# Patient Record
Sex: Female | Born: 1940 | Race: White | Hispanic: No | Smoking: Former smoker
Health system: Southern US, Community
[De-identification: ages and names within clinical notes are randomized; demographics above are authoritative.]

## PROBLEM LIST (undated history)

## (undated) DIAGNOSIS — B029 Zoster without complications: Secondary | ICD-10-CM

## (undated) DIAGNOSIS — M1990 Unspecified osteoarthritis, unspecified site: Secondary | ICD-10-CM

## (undated) DIAGNOSIS — F329 Major depressive disorder, single episode, unspecified: Secondary | ICD-10-CM

## (undated) DIAGNOSIS — E669 Obesity, unspecified: Secondary | ICD-10-CM

## (undated) DIAGNOSIS — R0602 Shortness of breath: Secondary | ICD-10-CM

## (undated) DIAGNOSIS — F32A Depression, unspecified: Secondary | ICD-10-CM

## (undated) DIAGNOSIS — R7303 Prediabetes: Secondary | ICD-10-CM

## (undated) DIAGNOSIS — E785 Hyperlipidemia, unspecified: Secondary | ICD-10-CM

## (undated) DIAGNOSIS — F419 Anxiety disorder, unspecified: Secondary | ICD-10-CM

## (undated) DIAGNOSIS — J42 Unspecified chronic bronchitis: Secondary | ICD-10-CM

## (undated) DIAGNOSIS — I10 Essential (primary) hypertension: Secondary | ICD-10-CM

## (undated) HISTORY — PX: TONSILLECTOMY: SUR1361

## (undated) HISTORY — PX: TUBAL LIGATION: SHX77

## (undated) HISTORY — PX: JOINT REPLACEMENT: SHX530

## (undated) HISTORY — DX: Prediabetes: R73.03

## (undated) HISTORY — DX: Hyperlipidemia, unspecified: E78.5

---

## 2008-10-11 ENCOUNTER — Emergency Department (HOSPITAL_COMMUNITY): Admission: EM | Admit: 2008-10-11 | Discharge: 2008-10-12 | Payer: Self-pay | Admitting: Emergency Medicine

## 2011-04-26 ENCOUNTER — Other Ambulatory Visit: Payer: Self-pay | Admitting: Family Medicine

## 2011-04-26 DIAGNOSIS — Z1231 Encounter for screening mammogram for malignant neoplasm of breast: Secondary | ICD-10-CM

## 2011-05-02 ENCOUNTER — Ambulatory Visit
Admission: RE | Admit: 2011-05-02 | Discharge: 2011-05-02 | Disposition: A | Discharge status: Home / Self Care | Payer: Medicare Other | Source: Ambulatory Visit | Attending: Family Medicine | Admitting: Family Medicine

## 2011-05-02 DIAGNOSIS — Z1231 Encounter for screening mammogram for malignant neoplasm of breast: Secondary | ICD-10-CM

## 2011-11-28 ENCOUNTER — Emergency Department (HOSPITAL_COMMUNITY): Payer: PRIVATE HEALTH INSURANCE

## 2011-11-28 ENCOUNTER — Encounter: Payer: Self-pay | Admitting: Emergency Medicine

## 2011-11-28 ENCOUNTER — Emergency Department (HOSPITAL_COMMUNITY)
Admission: EM | Admit: 2011-11-28 | Discharge: 2011-11-28 | Disposition: A | Discharge status: Home / Self Care | Payer: PRIVATE HEALTH INSURANCE | Attending: Emergency Medicine | Admitting: Emergency Medicine

## 2011-11-28 DIAGNOSIS — R42 Dizziness and giddiness: Secondary | ICD-10-CM | POA: Insufficient documentation

## 2011-11-28 DIAGNOSIS — J3489 Other specified disorders of nose and nasal sinuses: Secondary | ICD-10-CM | POA: Insufficient documentation

## 2011-11-28 DIAGNOSIS — I10 Essential (primary) hypertension: Secondary | ICD-10-CM | POA: Insufficient documentation

## 2011-11-28 HISTORY — DX: Obesity, unspecified: E66.9

## 2011-11-28 HISTORY — DX: Essential (primary) hypertension: I10

## 2011-11-28 LAB — BASIC METABOLIC PANEL
BUN: 18 mg/dL (ref 6–23)
Glucose, Bld: 131 mg/dL — ABNORMAL HIGH (ref 70–99)
Sodium: 138 mEq/L (ref 135–145)

## 2011-11-28 LAB — CBC
MCHC: 33.4 g/dL (ref 30.0–36.0)
Platelets: 292 10*3/uL (ref 150–400)
WBC: 10.7 10*3/uL — ABNORMAL HIGH (ref 4.0–10.5)

## 2011-11-28 MED ORDER — MECLIZINE HCL 25 MG PO TABS: 25.0000 mg | ORAL_TABLET | Freq: Four times a day (QID) | ORAL | Status: AC

## 2011-11-28 MED ORDER — MECLIZINE HCL 25 MG PO TABS
25.0000 mg | ORAL_TABLET | Freq: Once | ORAL | Status: AC
  Administered 2011-11-28: 25 mg via ORAL
  Filled 2011-11-28: qty 1

## 2011-11-28 MED ORDER — MOMETASONE FUROATE 50 MCG/ACT NA SUSP: 2.0000 | Freq: Every day | NASAL | Status: DC

## 2011-11-28 MED ORDER — LORAZEPAM 1 MG PO TABS
1.0000 mg | ORAL_TABLET | Freq: Once | ORAL | Status: AC
Start: 2011-11-28 — End: 2011-11-28
  Administered 2011-11-28: 1 mg via ORAL
  Filled 2011-11-28: qty 1

## 2011-11-28 MED ORDER — SODIUM CHLORIDE 0.9 % IV BOLUS (SEPSIS)
1000.0000 mL | Freq: Once | INTRAVENOUS | Status: AC
  Administered 2011-11-28: 1000 mL via INTRAVENOUS

## 2011-11-28 NOTE — ED Notes (Signed)
Pt ambulated in the hallway without difficulty. Denied dizziness, lightheaded or weakness while walking.

## 2011-11-28 NOTE — ED Notes (Signed)
PT back from CT.  AO x 4.  States dizziness only when she turns her head.

## 2011-11-28 NOTE — ED Notes (Signed)
PT. WOKE UP AT 3 AM THIS MORNING WITH DIZZINESS AND NAUSEA , NO VOMITTING , " I'M HUNGRY" ,  ALSO REPORTS FEELING SWEATY / DIZZINESS WORSE WHEN LYING DOWN .

## 2011-11-28 NOTE — ED Provider Notes (Signed)
History     CSN: [id number]  Arrival date & time 11/28/11  0405   First MD Initiated Contact with Patient 11/28/11 [phone]      Chief Complaint  Patient presents with  . Dizziness    (Consider location/radiation/quality/duration/timing/severity/associated sxs/prior treatment) The history is provided by the patient.   onset today although the and feeling congested for about last month. Sudden onset of severe dizziness when she turned her head to the right. Symptoms resolved when she is lying still and sitting up. She was able to drive herself to the hospital. She denies any headache or difficulty walking. No weakness or numbness. No history of same. She has not tried medications. No pain or radiation. Symptoms severe when present.  Past Medical History  Diagnosis Date  . Hypertension   . Obesity     Past Surgical History  Procedure Date  . Tubal ligation   . Tonsillectomy     No family history on file.  History  Substance Use Topics  . Smoking status: Former Games developer  . Smokeless tobacco: Not on file  . Alcohol Use: No    OB History    Grav Para Term Preterm Abortions TAB SAB Ect Mult Living                  Review of Systems  Constitutional: Negative for fever and chills.  HENT: Negative for neck pain and neck stiffness.   Eyes: Negative for pain.  Respiratory: Negative for shortness of breath.   Cardiovascular: Negative for chest pain.  Gastrointestinal: Negative for abdominal pain.  Genitourinary: Negative for dysuria.  Musculoskeletal: Negative for back pain.  Skin: Negative for rash.  Neurological: Positive for dizziness. Negative for headaches.  All other systems reviewed and are negative.    Allergies  Review of patient's allergies indicates no known allergies.  Home Medications   Current Outpatient Rx  Name Route Sig Dispense Refill  . PSEUDOEPHEDRINE HCL 30 MG PO TABS Oral Take 30 mg by mouth every 6 (six) hours as needed. Sinus congestion        BP 155/77  Pulse 72  Temp(Src) 97.7 F (36.5 C) (Oral)  Resp 17  SpO2 95%  Physical Exam  Constitutional: She is oriented to person, place, and time. She appears well-developed and well-nourished.  HENT:  Head: Normocephalic and atraumatic.  Right Ear: External ear normal.  Left Ear: External ear normal.  Mouth/Throat: Oropharynx is clear and moist. No oropharyngeal exudate.       Mild nasal congestion  Eyes: Conjunctivae and EOM are normal. Pupils are equal, round, and reactive to light.  Neck: Full passive range of motion without pain. Neck supple. No thyromegaly present.       No meningismus  Cardiovascular: Normal rate, regular rhythm, S1 normal, S2 normal and intact distal pulses.   Pulmonary/Chest: Effort normal and breath sounds normal.  Abdominal: Soft. Bowel sounds are normal. There is no tenderness. There is no CVA tenderness.  Musculoskeletal: Normal range of motion.  Neurological: She is alert and oriented to person, place, and time. She has normal strength and normal reflexes. No cranial nerve deficit or sensory deficit. She displays a negative Romberg sign. GCS eye subscore is 4. GCS verbal subscore is 5. GCS motor subscore is 6.       Normal Gait. No nystagmus  Skin: Skin is warm and dry. No rash noted. No cyanosis. Nails show no clubbing.  Psychiatric: She has a normal mood and affect. Her speech is  normal and behavior is normal.    ED Course  Procedures (including critical care time)  Labs Reviewed  CBC - Abnormal; Notable for the following:    WBC 10.7 (*)    All other components within normal limits  BASIC METABOLIC PANEL - Abnormal; Notable for the following:    Glucose, Bld 131 (*)    GFR calc non Af Amer 89 (*)    All other components within normal limits   Ct Head Wo Contrast  11/28/2011  *RADIOLOGY REPORT*  Clinical Data: Dizziness and sweating.  Heaviness in the right year.  Sore throat.  CT HEAD WITHOUT CONTRAST  Technique:  Contiguous axial  images were obtained from the base of the skull through the vertex without contrast.  Comparison: None.  Findings: The ventricles and sulci appear symmetrical without mass effect or midline shift.  No abnormal extra-axial fluid collections.  No ventricular dilatation.  Gray-white matter junctions are distinct.  Basal cisterns are not effaced.  No evidence of acute intracranial hemorrhage. Vascular calcifications. No depressed skull fractures.  Visualized paranasal sinuses are not opacified.  IMPRESSION: No evidence of acute intracranial hemorrhage, mass lesion, or acute infarct.  Original Report Authenticated By: Marlon Pel, M.D.   IV fluids. Ativan by mouth. Antivert by mouth. Labs and CT scan obtained and reviewed as above. Recheck at 7:30 AM is feeling much better, ambulating no acute distress and has no focal deficits on neuro exam.   MDM   Clinical presentation is consistent with benign positional vertigo. Condition improved in the ED. Findings on exam to suggest need for emergent MRI at this time. Plan prescriptions for Antivert and Nasonex for persistent congestion. Patient has primary care followup and agrees to take her prescribed lisinopril for her blood pressure.        Sunnie Nielsen, MD 11/28/11 [phone]

## 2011-11-28 NOTE — ED Notes (Signed)
Pt to ED c/o waking up at 3 am feeling dizzy and in a sweat.  She sat up and slowly the dizziness disappeared.  She laid back and the dizziness increased.  Pt states "heaviness" to R ear, sore throat, and swollen submandibular lymph node.  No slurred speech, strong grips bil, ao x 4.

## 2011-11-28 NOTE — ED Notes (Signed)
Family at bedside. 

## 2013-08-13 ENCOUNTER — Other Ambulatory Visit: Payer: Self-pay | Admitting: Family Medicine

## 2013-08-13 ENCOUNTER — Other Ambulatory Visit: Payer: Self-pay

## 2013-08-13 DIAGNOSIS — Z1231 Encounter for screening mammogram for malignant neoplasm of breast: Secondary | ICD-10-CM

## 2014-03-10 ENCOUNTER — Other Ambulatory Visit: Payer: Self-pay | Admitting: Family Medicine

## 2014-03-10 DIAGNOSIS — M25561 Pain in right knee: Secondary | ICD-10-CM

## 2014-03-24 ENCOUNTER — Other Ambulatory Visit: Payer: Medicare Other

## 2014-03-29 ENCOUNTER — Other Ambulatory Visit: Payer: Self-pay

## 2014-03-29 ENCOUNTER — Other Ambulatory Visit: Payer: Self-pay | Admitting: Family Medicine

## 2014-03-29 DIAGNOSIS — Z1231 Encounter for screening mammogram for malignant neoplasm of breast: Secondary | ICD-10-CM

## 2014-03-29 DIAGNOSIS — M25561 Pain in right knee: Secondary | ICD-10-CM

## 2014-03-30 ENCOUNTER — Ambulatory Visit
Admission: RE | Admit: 2014-03-30 | Discharge: 2014-03-30 | Disposition: A | Discharge status: Home / Self Care | Payer: PRIVATE HEALTH INSURANCE | Source: Ambulatory Visit | Attending: Family Medicine | Admitting: Family Medicine

## 2014-03-30 DIAGNOSIS — M25561 Pain in right knee: Secondary | ICD-10-CM

## 2014-04-13 ENCOUNTER — Encounter (INDEPENDENT_AMBULATORY_CARE_PROVIDER_SITE_OTHER): Payer: Self-pay

## 2014-04-13 ENCOUNTER — Ambulatory Visit
Admission: RE | Admit: 2014-04-13 | Discharge: 2014-04-13 | Disposition: A | Discharge status: Home / Self Care | Payer: PRIVATE HEALTH INSURANCE | Source: Ambulatory Visit

## 2014-04-13 DIAGNOSIS — Z1231 Encounter for screening mammogram for malignant neoplasm of breast: Secondary | ICD-10-CM

## 2014-07-09 ENCOUNTER — Ambulatory Visit
Admission: RE | Admit: 2014-07-09 | Discharge: 2014-07-09 | Disposition: A | Discharge status: Home / Self Care | Payer: PRIVATE HEALTH INSURANCE | Source: Ambulatory Visit | Attending: Family Medicine | Admitting: Family Medicine

## 2014-07-09 ENCOUNTER — Other Ambulatory Visit: Payer: Self-pay | Admitting: Family Medicine

## 2014-07-09 DIAGNOSIS — R05 Cough: Secondary | ICD-10-CM

## 2014-07-09 DIAGNOSIS — R059 Cough, unspecified: Secondary | ICD-10-CM

## 2014-07-21 ENCOUNTER — Encounter (HOSPITAL_COMMUNITY): Payer: Self-pay | Admitting: Pharmacy Technician

## 2014-07-21 NOTE — H&P (Signed)
  [facility] [address]   [address] [place], [place] [address] [phone] A Division of [facility]  Loreta Ave, M.D.   Robert A. Thurston Hole, M.D.   Burnell Blanks, M.D.   Eulas Post, M.D.   Lunette Stands, M.D Jewel Baize. Eulah Pont, M.D.  Buford Dresser, M.D.  Estell Harpin, M.D.    Melina Fiddler, M.D. Mary L. Isidoro Donning, PA-C  Kirstin A. Shepperson, PA-C  Josh [place] [employer], PA-C [place], [place]                                                                    RE: Bailey Solomon, Bailey Solomon                                    [id number]      DOB: Mar 31, 1941 PROGRESS NOTE: 04-09-14 Reason for visit: Referral from Dr. Cleophas Dunker for right knee pain.    History of present illness: She is a pleasant 73 year-old woman who has had roughly 20 years of right knee pain.  She has treated it with NSAIDs, diet supplements and activity modification.  She has pain daily.  It is primarily on the medial side.   Please see associated documentation for this clinic visit for further past medical, family, surgical and social history, review of systems, and exam findings as this was reviewed by me.  EXAMINATION: Well appearing female in no apparent distress.  Range of motion is actually pretty good.  She is neurovascularly intact.  She does have some correctable varus.  Tenderness on her medial joint line.    X-RAYS: X-rays reviewed by me: four views of her right knee demonstrate severe medial compartment arthritis, some mild lateral compartment and posterior wear on the medial side to indicate laxity of her ACL.    ASSESSMENT: Severe arthritis of the right knee.  PLAN: I have offered her more conservative treatment, as there is a lot she has not tried.  She would like to hold off on an injection at this time.  She is not interested in undergoing injections.  We will do Mobic and a knee sleeve at this time.  Based on her x-rays, should she elect to  go forward with total knee arthroplasty, I would again recommend that she try injection first, but if she staunchly against this given her severe arthritis on x-ray and long history of pain we could undergo total knee arthroplasty, if this were her desire.  See me on an as needed basis.    Jewel Baize.  Eulah Pont, M.D.  Electronically verified by Jewel Baize. Eulah Pont, M.D. TDM:jjh D 04-09-14 T 04-12-14

## 2014-07-23 NOTE — Pre-Procedure Instructions (Signed)
Len[place] COLTON ENGDAHL  07/23/2014   Your procedure is scheduled on:  Tuesday, September 8th  Report to [facility] Admitting at 1100 AM.  Call this number if you have problems the morning of surgery: [phone]   Remember:   Do not eat food or drink liquids after midnight.   Take these medicines the morning of surgery with A SIP OF WATER: valium if needed   Do not wear jewelry, make-up or nail polish.  Do not wear lotions, powders, or perfumes. You may wear deodorant.  Do not shave 48 hours prior to surgery. Men may shave face and neck.  Do not bring valuables to the hospital.  [facility] is not responsible  for any belongings or valuables.               Contacts, dentures or bridgework may not be worn into surgery.  Leave suitcase in the car. After surgery it may be brought to your room.  For patients admitted to the hospital, discharge time is determined by your  treatment team.              Please read over the following fact sheets that you were given: Pain Booklet, Coughing and Deep Breathing, Blood Transfusion Information, MRSA Information and Surgical Site Infection Prevention [place] - Preparing for Surgery  Before surgery, you can play an important role.  Because skin is not sterile, your skin needs to be as free of germs as possible.  You can reduce the number of germs on you skin by washing with CHG (chlorahexidine gluconate) soap before surgery.  CHG is an antiseptic cleaner which kills germs and bonds with the skin to continue killing germs even after washing.  Please DO NOT use if you have an allergy to CHG or antibacterial soaps.  If your skin becomes reddened/irritated stop using the CHG and inform your nurse when you arrive at Short Stay.  Do not shave (including legs and underarms) for at least 48 hours prior to the first CHG shower.  You may shave your face.  Please follow these instructions carefully:   1.  Shower with CHG Soap the night before surgery  and the morning of Surgery.  2.  If you choose to wash your hair, wash your hair first as usual with your normal shampoo.  3.  After you shampoo, rinse your hair and body thoroughly to remove the shampoo.  4.  Use CHG as you would any other liquid soap.  You can apply CHG directly to the skin and wash gently with scrungie or a clean washcloth.  5.  Apply the CHG Soap to your body ONLY FROM THE NECK DOWN.  Do not use on open wounds or open sores.  Avoid contact with your eyes, ears, mouth and genitals (private parts).  Wash genitals (private parts) with your normal soap.  6.  Wash thoroughly, paying special attention to the area where your surgery will be performed.  7.  Thoroughly rinse your body with warm water from the neck down.  8.  DO NOT shower/wash with your normal soap after using and rinsing off the CHG Soap.  9.  Pat yourself dry with a clean towel.            10.  Wear clean pajamas.            11.  Place clean sheets on your bed the night of your first shower and do not sleep with pets.  Day  of Surgery  Do not apply any lotions/deoderants the morning of surgery.  Please wear clean clothes to the hospital/surgery center.

## 2014-07-26 ENCOUNTER — Encounter (HOSPITAL_COMMUNITY)
Admission: RE | Admit: 2014-07-26 | Discharge: 2014-07-26 | Disposition: A | Discharge status: Home / Self Care | Payer: PRIVATE HEALTH INSURANCE | Source: Ambulatory Visit | Attending: Orthopedic Surgery | Admitting: Orthopedic Surgery

## 2014-07-26 ENCOUNTER — Encounter (HOSPITAL_COMMUNITY): Payer: Self-pay

## 2014-07-26 DIAGNOSIS — Z01818 Encounter for other preprocedural examination: Secondary | ICD-10-CM | POA: Diagnosis present

## 2014-07-26 DIAGNOSIS — M1710 Unilateral primary osteoarthritis, unspecified knee: Secondary | ICD-10-CM | POA: Insufficient documentation

## 2014-07-26 HISTORY — DX: Zoster without complications: B02.9

## 2014-07-26 HISTORY — DX: Anxiety disorder, unspecified: F41.9

## 2014-07-26 HISTORY — DX: Major depressive disorder, single episode, unspecified: F32.9

## 2014-07-26 HISTORY — DX: Depression, unspecified: F32.A

## 2014-07-26 HISTORY — DX: Unspecified osteoarthritis, unspecified site: M19.90

## 2014-07-26 LAB — BASIC METABOLIC PANEL
Anion gap: 15 (ref 5–15)
BUN: 14 mg/dL (ref 6–23)
CALCIUM: 9.8 mg/dL (ref 8.4–10.5)
CHLORIDE: 102 meq/L (ref 96–112)
CO2: 23 meq/L (ref 19–32)
CREATININE: 0.63 mg/dL (ref 0.50–1.10)
GFR calc Af Amer: >90 mL/min (ref >90)
GFR calc non Af Amer: 88 mL/min — ABNORMAL LOW (ref >90)
Glucose, Bld: 131 mg/dL — ABNORMAL HIGH (ref 70–99)
Potassium: 4.2 mEq/L (ref 3.7–5.3)
Sodium: 140 mEq/L (ref 137–147)

## 2014-07-26 LAB — URINALYSIS, ROUTINE W REFLEX MICROSCOPIC
Bilirubin Urine: NEGATIVE
Glucose, UA: NEGATIVE mg/dL
Hgb urine dipstick: NEGATIVE
KETONES UR: NEGATIVE mg/dL
LEUKOCYTES UA: NEGATIVE
NITRITE: NEGATIVE
PROTEIN: NEGATIVE mg/dL
Specific Gravity, Urine: 1.021 (ref 1.005–1.030)
Urobilinogen, UA: 1 mg/dL (ref 0.0–1.0)
pH: 7 (ref 5.0–8.0)

## 2014-07-26 LAB — CBC
HEMATOCRIT: 45 % (ref 36.0–46.0)
HEMOGLOBIN: 14.8 g/dL (ref 12.0–15.0)
MCH: 28.1 pg (ref 26.0–34.0)
MCHC: 32.9 g/dL (ref 30.0–36.0)
MCV: 85.4 fL (ref 78.0–100.0)
Platelets: 292 10*3/uL (ref 150–400)
RBC: 5.27 MIL/uL — ABNORMAL HIGH (ref 3.87–5.11)
RDW: 14.1 % (ref 11.5–15.5)
WBC: 10 10*3/uL (ref 4.0–10.5)

## 2014-07-26 LAB — ABO/RH: ABO/RH(D): A POS

## 2014-07-26 LAB — TYPE AND SCREEN
ABO/RH(D): A POS
Antibody Screen: NEGATIVE

## 2014-07-26 LAB — SURGICAL PCR SCREEN
MRSA, PCR: NEGATIVE
Staphylococcus aureus: NEGATIVE

## 2014-07-26 LAB — PROTIME-INR
INR: 1.16 (ref 0.00–1.49)
Prothrombin Time: 14.8 seconds (ref 11.6–15.2)

## 2014-07-26 LAB — APTT: aPTT: 32 seconds (ref 24–37)

## 2014-07-26 NOTE — Progress Notes (Signed)
Primary - Bailey Solomon [facility] No cardiologist ekg and chest xray at dr. Jeannetta Nap office few weeks ago - will request

## 2014-07-26 NOTE — Progress Notes (Signed)
07/26/14 1107  OBSTRUCTIVE SLEEP APNEA  Have you ever been diagnosed with sleep apnea through a sleep study? No  Do you snore loudly (loud enough to be heard through closed doors)?  1  Do you often feel tired, fatigued, or sleepy during the daytime? 0  Has anyone observed you stop breathing during your sleep? 0  Do you have, or are you being treated for high blood pressure? 1  BMI more than 35 kg/m2? 1  Age over 72 years old? 1  Neck circumference greater than 40 cm/16 inches? 1  Gender: 0  Obstructive Sleep Apnea Score 5  Score 4 or greater  Results sent to PCP

## 2014-07-27 LAB — URINE CULTURE
Colony Count: NO GROWTH
Culture: NO GROWTH

## 2014-07-29 ENCOUNTER — Ambulatory Visit: Payer: PRIVATE HEALTH INSURANCE | Admitting: Dietician

## 2014-08-02 MED ORDER — ACETAMINOPHEN 500 MG PO TABS
1000.0000 mg | ORAL_TABLET | Freq: Once | ORAL | Status: DC
  Filled 2014-08-02: qty 2

## 2014-08-02 MED ORDER — DEXTROSE-NACL 5-0.45 % IV SOLN: 100.0000 mL/h | INTRAVENOUS | Status: DC

## 2014-08-02 MED ORDER — CHLORHEXIDINE GLUCONATE 4 % EX LIQD
60.0000 mL | Freq: Once | CUTANEOUS | Status: DC
  Filled 2014-08-02: qty 60

## 2014-08-02 MED ORDER — CEFAZOLIN SODIUM-DEXTROSE 2-3 GM-% IV SOLR
2.0000 g | INTRAVENOUS | Status: AC
  Administered 2014-08-03: 2 g via INTRAVENOUS
  Filled 2014-08-02: qty 50

## 2014-08-03 ENCOUNTER — Encounter (HOSPITAL_COMMUNITY): Payer: Self-pay

## 2014-08-03 ENCOUNTER — Inpatient Hospital Stay (HOSPITAL_COMMUNITY): Payer: PRIVATE HEALTH INSURANCE | Admitting: Anesthesiology

## 2014-08-03 ENCOUNTER — Encounter (HOSPITAL_COMMUNITY): Payer: PRIVATE HEALTH INSURANCE | Admitting: Anesthesiology

## 2014-08-03 ENCOUNTER — Inpatient Hospital Stay (HOSPITAL_COMMUNITY)
Admission: RE | Admit: 2014-08-03 | Discharge: 2014-08-05 | DRG: 470 | Disposition: A | Discharge status: Home Health | Payer: PRIVATE HEALTH INSURANCE | Source: Ambulatory Visit | Attending: Orthopedic Surgery | Admitting: Orthopedic Surgery

## 2014-08-03 ENCOUNTER — Inpatient Hospital Stay (HOSPITAL_COMMUNITY): Payer: PRIVATE HEALTH INSURANCE

## 2014-08-03 ENCOUNTER — Encounter (HOSPITAL_COMMUNITY)
Admission: RE | Disposition: A | Discharge status: Home Health | Payer: Self-pay | Source: Ambulatory Visit | Attending: Orthopedic Surgery

## 2014-08-03 DIAGNOSIS — M25569 Pain in unspecified knee: Secondary | ICD-10-CM | POA: Diagnosis present

## 2014-08-03 DIAGNOSIS — Z791 Long term (current) use of non-steroidal anti-inflammatories (NSAID): Secondary | ICD-10-CM | POA: Diagnosis not present

## 2014-08-03 DIAGNOSIS — Z6841 Body Mass Index (BMI) 40.0 and over, adult: Secondary | ICD-10-CM

## 2014-08-03 DIAGNOSIS — Z87891 Personal history of nicotine dependence: Secondary | ICD-10-CM | POA: Diagnosis not present

## 2014-08-03 DIAGNOSIS — I10 Essential (primary) hypertension: Secondary | ICD-10-CM | POA: Diagnosis present

## 2014-08-03 DIAGNOSIS — F3289 Other specified depressive episodes: Secondary | ICD-10-CM | POA: Diagnosis present

## 2014-08-03 DIAGNOSIS — M1710 Unilateral primary osteoarthritis, unspecified knee: Secondary | ICD-10-CM | POA: Diagnosis present

## 2014-08-03 DIAGNOSIS — F411 Generalized anxiety disorder: Secondary | ICD-10-CM | POA: Diagnosis present

## 2014-08-03 DIAGNOSIS — Z79899 Other long term (current) drug therapy: Secondary | ICD-10-CM | POA: Diagnosis not present

## 2014-08-03 DIAGNOSIS — Z7982 Long term (current) use of aspirin: Secondary | ICD-10-CM | POA: Diagnosis not present

## 2014-08-03 DIAGNOSIS — F329 Major depressive disorder, single episode, unspecified: Secondary | ICD-10-CM | POA: Diagnosis present

## 2014-08-03 HISTORY — PX: TOTAL KNEE ARTHROPLASTY: SHX125

## 2014-08-03 HISTORY — DX: Shortness of breath: R06.02

## 2014-08-03 HISTORY — DX: Unspecified chronic bronchitis: J42

## 2014-08-03 SURGERY — ARTHROPLASTY, KNEE, TOTAL
Anesthesia: General | Site: Knee | Laterality: Right

## 2014-08-03 MED ORDER — BUPIVACAINE LIPOSOME 1.3 % IJ SUSP
INTRAMUSCULAR | Status: DC | PRN
  Administered 2014-08-03: 20 mL

## 2014-08-03 MED ORDER — METHOCARBAMOL 1000 MG/10ML IJ SOLN
500.0000 mg | Freq: Four times a day (QID) | INTRAMUSCULAR | Status: DC | PRN
  Filled 2014-08-03: qty 5

## 2014-08-03 MED ORDER — ACETAMINOPHEN 325 MG PO TABS
650.0000 mg | ORAL_TABLET | Freq: Four times a day (QID) | ORAL | Status: DC | PRN
  Filled 2014-08-03: qty 2

## 2014-08-03 MED ORDER — ASPIRIN EC 325 MG PO TBEC
325.0000 mg | DELAYED_RELEASE_TABLET | Freq: Every day | ORAL | Status: DC
  Filled 2014-08-03 (×3): qty 1

## 2014-08-03 MED ORDER — ACETAMINOPHEN 10 MG/ML IV SOLN
INTRAVENOUS | Status: AC
  Filled 2014-08-03: qty 100

## 2014-08-03 MED ORDER — DEXTROSE-NACL 5-0.45 % IV SOLN: INTRAVENOUS | Status: AC

## 2014-08-03 MED ORDER — 0.9 % SODIUM CHLORIDE (POUR BTL) OPTIME
TOPICAL | Status: DC | PRN
  Administered 2014-08-03: 1000 mL

## 2014-08-03 MED ORDER — ONDANSETRON HCL 4 MG PO TABS
4.0000 mg | ORAL_TABLET | Freq: Four times a day (QID) | ORAL | Status: DC | PRN
  Administered 2014-08-05: 4 mg via ORAL
  Filled 2014-08-03: qty 1

## 2014-08-03 MED ORDER — DEXAMETHASONE SODIUM PHOSPHATE 10 MG/ML IJ SOLN
INTRAMUSCULAR | Status: DC | PRN
  Administered 2014-08-03: 10 mg via INTRAVENOUS

## 2014-08-03 MED ORDER — HYDROMORPHONE HCL PF 1 MG/ML IJ SOLN
0.2500 mg | INTRAMUSCULAR | Status: DC | PRN
  Administered 2014-08-03 (×2): 0.5 mg via INTRAVENOUS

## 2014-08-03 MED ORDER — HYDROMORPHONE HCL PF 1 MG/ML IJ SOLN
INTRAMUSCULAR | Status: AC
  Administered 2014-08-03: 0.5 mg via INTRAVENOUS
  Filled 2014-08-03: qty 2

## 2014-08-03 MED ORDER — DEXTROSE 5 % IV SOLN
INTRAVENOUS | Status: DC | PRN
  Administered 2014-08-03: 14:00:00 via INTRAVENOUS

## 2014-08-03 MED ORDER — PROPOFOL 10 MG/ML IV BOLUS
INTRAVENOUS | Status: AC
  Filled 2014-08-03: qty 20

## 2014-08-03 MED ORDER — ROCURONIUM BROMIDE 100 MG/10ML IV SOLN
INTRAVENOUS | Status: DC | PRN
  Administered 2014-08-03: 50 mg via INTRAVENOUS

## 2014-08-03 MED ORDER — ACETAMINOPHEN 10 MG/ML IV SOLN
INTRAVENOUS | Status: DC | PRN
  Administered 2014-08-03: 1000 mg via INTRAVENOUS

## 2014-08-03 MED ORDER — MENTHOL 3 MG MT LOZG: 1.0000 | LOZENGE | OROMUCOSAL | Status: DC | PRN

## 2014-08-03 MED ORDER — PROPOFOL 10 MG/ML IV BOLUS
INTRAVENOUS | Status: DC | PRN
  Administered 2014-08-03: 200 mg via INTRAVENOUS
  Administered 2014-08-03: 50 mg via INTRAVENOUS

## 2014-08-03 MED ORDER — ARTIFICIAL TEARS OP OINT
TOPICAL_OINTMENT | OPHTHALMIC | Status: AC
  Filled 2014-08-03: qty 3.5

## 2014-08-03 MED ORDER — METHOCARBAMOL 500 MG PO TABS
500.0000 mg | ORAL_TABLET | Freq: Four times a day (QID) | ORAL | Status: DC | PRN
  Administered 2014-08-03: 500 mg via ORAL

## 2014-08-03 MED ORDER — MORPHINE SULFATE 2 MG/ML IJ SOLN: 2.0000 mg | INTRAMUSCULAR | Status: DC | PRN

## 2014-08-03 MED ORDER — OXYCODONE-ACETAMINOPHEN 5-325 MG PO TABS: 2.0000 | ORAL_TABLET | ORAL | Status: DC | PRN

## 2014-08-03 MED ORDER — ARTIFICIAL TEARS OP OINT
TOPICAL_OINTMENT | OPHTHALMIC | Status: DC | PRN
  Administered 2014-08-03: 1 via OPHTHALMIC

## 2014-08-03 MED ORDER — OXYCODONE HCL 5 MG/5ML PO SOLN: 5.0000 mg | Freq: Once | ORAL | Status: DC | PRN

## 2014-08-03 MED ORDER — CELECOXIB 200 MG PO CAPS: 200.0000 mg | ORAL_CAPSULE | Freq: Two times a day (BID) | ORAL | Status: DC

## 2014-08-03 MED ORDER — PROMETHAZINE HCL 25 MG/ML IJ SOLN
INTRAMUSCULAR | Status: AC
  Filled 2014-08-03: qty 1

## 2014-08-03 MED ORDER — LABETALOL HCL 5 MG/ML IV SOLN
INTRAVENOUS | Status: AC
  Filled 2014-08-03: qty 4

## 2014-08-03 MED ORDER — NEOSTIGMINE METHYLSULFATE 10 MG/10ML IV SOLN
INTRAVENOUS | Status: DC | PRN
  Administered 2014-08-03: 5 mg via INTRAVENOUS

## 2014-08-03 MED ORDER — ROCURONIUM BROMIDE 50 MG/5ML IV SOLN
INTRAVENOUS | Status: AC
  Filled 2014-08-03: qty 1

## 2014-08-03 MED ORDER — LOSARTAN POTASSIUM 50 MG PO TABS
50.0000 mg | ORAL_TABLET | Freq: Every day | ORAL | Status: DC
  Administered 2014-08-03 – 2014-08-05 (×3): 50 mg via ORAL
  Filled 2014-08-03 (×3): qty 1

## 2014-08-03 MED ORDER — CEFAZOLIN SODIUM-DEXTROSE 2-3 GM-% IV SOLR
INTRAVENOUS | Status: AC
  Filled 2014-08-03: qty 50

## 2014-08-03 MED ORDER — FENTANYL CITRATE 0.05 MG/ML IJ SOLN
INTRAMUSCULAR | Status: AC
  Filled 2014-08-03: qty 5

## 2014-08-03 MED ORDER — SODIUM CHLORIDE 0.9 % IR SOLN
Status: DC | PRN
  Administered 2014-08-03: 1000 mL

## 2014-08-03 MED ORDER — DEXAMETHASONE 4 MG PO TABS
10.0000 mg | ORAL_TABLET | Freq: Three times a day (TID) | ORAL | Status: AC
  Administered 2014-08-03: 10 mg via ORAL
  Filled 2014-08-03 (×3): qty 1

## 2014-08-03 MED ORDER — DEXAMETHASONE SODIUM PHOSPHATE 10 MG/ML IJ SOLN
10.0000 mg | Freq: Three times a day (TID) | INTRAMUSCULAR | Status: AC
  Administered 2014-08-04: 10 mg via INTRAVENOUS
  Filled 2014-08-03 (×3): qty 1

## 2014-08-03 MED ORDER — SODIUM CHLORIDE 0.9 % IJ SOLN
INTRAMUSCULAR | Status: DC | PRN
  Administered 2014-08-03: 40 mL via INTRAVENOUS

## 2014-08-03 MED ORDER — GLYCOPYRROLATE 0.2 MG/ML IJ SOLN
INTRAMUSCULAR | Status: AC
  Filled 2014-08-03: qty 4

## 2014-08-03 MED ORDER — METOCLOPRAMIDE HCL 10 MG PO TABS: 5.0000 mg | ORAL_TABLET | Freq: Three times a day (TID) | ORAL | Status: DC | PRN

## 2014-08-03 MED ORDER — BUPIVACAINE LIPOSOME 1.3 % IJ SUSP
20.0000 mL | Freq: Once | INTRAMUSCULAR | Status: DC
  Filled 2014-08-03: qty 20

## 2014-08-03 MED ORDER — ONDANSETRON HCL 4 MG/2ML IJ SOLN
INTRAMUSCULAR | Status: AC
  Filled 2014-08-03: qty 2

## 2014-08-03 MED ORDER — GLYCOPYRROLATE 0.2 MG/ML IJ SOLN
INTRAMUSCULAR | Status: DC | PRN
  Administered 2014-08-03: .8 mg via INTRAVENOUS

## 2014-08-03 MED ORDER — ASPIRIN EC 325 MG PO TBEC: 325.0000 mg | DELAYED_RELEASE_TABLET | Freq: Every day | ORAL | Status: DC

## 2014-08-03 MED ORDER — ONDANSETRON HCL 4 MG/2ML IJ SOLN: 4.0000 mg | Freq: Four times a day (QID) | INTRAMUSCULAR | Status: DC | PRN

## 2014-08-03 MED ORDER — LACTATED RINGERS IV SOLN: INTRAVENOUS | Status: DC

## 2014-08-03 MED ORDER — LABETALOL HCL 5 MG/ML IV SOLN
INTRAVENOUS | Status: DC | PRN
  Administered 2014-08-03 (×4): 5 mg via INTRAVENOUS

## 2014-08-03 MED ORDER — LACTATED RINGERS IV SOLN
INTRAVENOUS | Status: DC | PRN
  Administered 2014-08-03 (×2): via INTRAVENOUS

## 2014-08-03 MED ORDER — CEFAZOLIN SODIUM-DEXTROSE 2-3 GM-% IV SOLR
2.0000 g | Freq: Four times a day (QID) | INTRAVENOUS | Status: AC
  Administered 2014-08-03 – 2014-08-04 (×2): 2 g via INTRAVENOUS
  Filled 2014-08-03 (×2): qty 50

## 2014-08-03 MED ORDER — DOCUSATE SODIUM 100 MG PO CAPS: 100.0000 mg | ORAL_CAPSULE | Freq: Two times a day (BID) | ORAL | Status: DC

## 2014-08-03 MED ORDER — DIAZEPAM 5 MG PO TABS
2.5000 mg | ORAL_TABLET | ORAL | Status: DC
  Administered 2014-08-05: 2.5 mg via ORAL
  Filled 2014-08-03: qty 1

## 2014-08-03 MED ORDER — MIDAZOLAM HCL 5 MG/5ML IJ SOLN
INTRAMUSCULAR | Status: DC | PRN
  Administered 2014-08-03: 1 mg via INTRAVENOUS

## 2014-08-03 MED ORDER — METOCLOPRAMIDE HCL 5 MG/ML IJ SOLN: 5.0000 mg | Freq: Three times a day (TID) | INTRAMUSCULAR | Status: DC | PRN

## 2014-08-03 MED ORDER — OXYCODONE HCL 5 MG PO TABS: 5.0000 mg | ORAL_TABLET | Freq: Once | ORAL | Status: DC | PRN

## 2014-08-03 MED ORDER — ONDANSETRON HCL 4 MG/2ML IJ SOLN
INTRAMUSCULAR | Status: DC | PRN
  Administered 2014-08-03: 4 mg via INTRAVENOUS

## 2014-08-03 MED ORDER — PROMETHAZINE HCL 25 MG/ML IJ SOLN
6.2500 mg | INTRAMUSCULAR | Status: DC | PRN
  Administered 2014-08-03: 6.25 mg via INTRAVENOUS

## 2014-08-03 MED ORDER — PHENOL 1.4 % MT LIQD: 1.0000 | OROMUCOSAL | Status: DC | PRN

## 2014-08-03 MED ORDER — OXYCODONE HCL 5 MG PO TABS
5.0000 mg | ORAL_TABLET | ORAL | Status: DC | PRN
  Administered 2014-08-03 – 2014-08-04 (×2): 5 mg via ORAL
  Administered 2014-08-04: 10 mg via ORAL
  Filled 2014-08-03 (×2): qty 2

## 2014-08-03 MED ORDER — CELECOXIB 200 MG PO CAPS
200.0000 mg | ORAL_CAPSULE | Freq: Two times a day (BID) | ORAL | Status: DC
  Administered 2014-08-03 – 2014-08-04 (×2): 200 mg via ORAL
  Filled 2014-08-03 (×5): qty 1

## 2014-08-03 MED ORDER — METHOCARBAMOL 500 MG PO TABS
ORAL_TABLET | ORAL | Status: AC
  Administered 2014-08-03: 500 mg via ORAL
  Filled 2014-08-03: qty 1

## 2014-08-03 MED ORDER — FENTANYL CITRATE 0.05 MG/ML IJ SOLN
INTRAMUSCULAR | Status: DC | PRN
  Administered 2014-08-03: 100 ug via INTRAVENOUS

## 2014-08-03 MED ORDER — ACETAMINOPHEN 650 MG RE SUPP: 650.0000 mg | Freq: Four times a day (QID) | RECTAL | Status: DC | PRN

## 2014-08-03 MED ORDER — DOCUSATE SODIUM 100 MG PO CAPS
100.0000 mg | ORAL_CAPSULE | Freq: Two times a day (BID) | ORAL | Status: DC
  Administered 2014-08-03 – 2014-08-04 (×2): 100 mg via ORAL
  Filled 2014-08-03 (×4): qty 1

## 2014-08-03 SURGICAL SUPPLY — 65 items
ADH SKN CLS LQ APL DERMABOND (GAUZE/BANDAGES/DRESSINGS)
BANDAGE ESMARK 6X9 LF (GAUZE/BANDAGES/DRESSINGS) ×1 IMPLANT
BLADE SAG 18X100X1.27 (BLADE) ×6 IMPLANT
BNDG CMPR 9X6 STRL LF SNTH (GAUZE/BANDAGES/DRESSINGS) ×1
BNDG COHESIVE 6X5 TAN STRL LF (GAUZE/BANDAGES/DRESSINGS) ×3 IMPLANT
BNDG ESMARK 6X9 LF (GAUZE/BANDAGES/DRESSINGS) ×3
BOWL SMART MIX CTS (DISPOSABLE) ×3 IMPLANT
CEMENT BONE SIMPLEX SPEEDSET (Cement) ×6 IMPLANT
CLOSURE STERI-STRIP 1/2X4 (GAUZE/BANDAGES/DRESSINGS) ×1
CLOSURE WOUND 1/2 X4 (GAUZE/BANDAGES/DRESSINGS)
CLSR STERI-STRIP ANTIMIC 1/2X4 (GAUZE/BANDAGES/DRESSINGS) ×2 IMPLANT
COVER SURGICAL LIGHT HANDLE (MISCELLANEOUS) ×3 IMPLANT
CUFF TOURNIQUET SINGLE 34IN LL (TOURNIQUET CUFF) ×3 IMPLANT
DERMABOND ADHESIVE PROPEN (GAUZE/BANDAGES/DRESSINGS)
DERMABOND ADVANCED .7 DNX6 (GAUZE/BANDAGES/DRESSINGS) IMPLANT
DRAPE EXTREMITY T 121X128X90 (DRAPE) ×3 IMPLANT
DRAPE IMP U-DRAPE 54X76 (DRAPES) IMPLANT
DRAPE PROXIMA HALF (DRAPES) ×3 IMPLANT
DRAPE U-SHAPE 47X51 STRL (DRAPES) ×3 IMPLANT
DRSG ADAPTIC 3X8 NADH LF (GAUZE/BANDAGES/DRESSINGS) IMPLANT
DRSG AQUACEL AG ADV 3.5X10 (GAUZE/BANDAGES/DRESSINGS) ×3 IMPLANT
DRSG PAD ABDOMINAL 8X10 ST (GAUZE/BANDAGES/DRESSINGS) IMPLANT
DURAPREP 26ML APPLICATOR (WOUND CARE) ×6 IMPLANT
ELECT CAUTERY BLADE 6.4 (BLADE) ×3 IMPLANT
ELECT REM PT RETURN 9FT ADLT (ELECTROSURGICAL) ×3
ELECTRODE REM PT RTRN 9FT ADLT (ELECTROSURGICAL) ×1 IMPLANT
FACESHIELD WRAPAROUND (MASK) ×6 IMPLANT
GAUZE SPONGE 4X4 12PLY STRL (GAUZE/BANDAGES/DRESSINGS) IMPLANT
GLOVE BIO SURGEON STRL SZ7.5 (GLOVE) ×3 IMPLANT
GLOVE BIOGEL PI IND STRL 8 (GLOVE) ×1 IMPLANT
GLOVE BIOGEL PI INDICATOR 8 (GLOVE) ×2
GOWN STRL REUS W/ TWL LRG LVL3 (GOWN DISPOSABLE) ×2 IMPLANT
GOWN STRL REUS W/ TWL XL LVL3 (GOWN DISPOSABLE) IMPLANT
GOWN STRL REUS W/TWL LRG LVL3 (GOWN DISPOSABLE) ×6
GOWN STRL REUS W/TWL XL LVL3 (GOWN DISPOSABLE)
HANDPIECE INTERPULSE COAX TIP (DISPOSABLE)
IMMOBILIZER KNEE 22 UNIV (SOFTGOODS) ×3 IMPLANT
IMMOBILIZER KNEE 24 THIGH 36 (MISCELLANEOUS) IMPLANT
IMMOBILIZER KNEE 24 UNIV (MISCELLANEOUS)
KIT BASIN OR (CUSTOM PROCEDURE TRAY) ×3 IMPLANT
KIT ROOM TURNOVER OR (KITS) ×3 IMPLANT
KNEE/VIT E POLY LINER LEVEL 1B ×3 IMPLANT
MANIFOLD NEPTUNE II (INSTRUMENTS) ×3 IMPLANT
NEEDLE 18GX1X1/2 (RX/OR ONLY) (NEEDLE) ×3 IMPLANT
NS IRRIG 1000ML POUR BTL (IV SOLUTION) ×3 IMPLANT
PACK TOTAL JOINT (CUSTOM PROCEDURE TRAY) ×3 IMPLANT
PAD ARMBOARD 7.5X6 YLW CONV (MISCELLANEOUS) ×6 IMPLANT
PAD CAST 4YDX4 CTTN HI CHSV (CAST SUPPLIES) IMPLANT
PADDING CAST COTTON 4X4 STRL (CAST SUPPLIES)
PADDING CAST COTTON 6X4 STRL (CAST SUPPLIES) IMPLANT
SET HNDPC FAN SPRY TIP SCT (DISPOSABLE) IMPLANT
STAPLER VISISTAT 35W (STAPLE) IMPLANT
STRIP CLOSURE SKIN 1/2X4 (GAUZE/BANDAGES/DRESSINGS) IMPLANT
SUCTION FRAZIER TIP 10 FR DISP (SUCTIONS) ×3 IMPLANT
SUT MNCRL AB 4-0 PS2 18 (SUTURE) ×3 IMPLANT
SUT MON AB 2-0 CT1 27 (SUTURE) IMPLANT
SUT VIC AB 0 CT1 27 (SUTURE) ×3
SUT VIC AB 0 CT1 27XBRD ANBCTR (SUTURE) ×1 IMPLANT
SUT VIC AB 1 CTX 36 (SUTURE) ×6
SUT VIC AB 1 CTX36XBRD ANBCTR (SUTURE) ×2 IMPLANT
SYR 50ML LL SCALE MARK (SYRINGE) ×3 IMPLANT
TOWEL OR 17X24 6PK STRL BLUE (TOWEL DISPOSABLE) ×3 IMPLANT
TOWEL OR 17X26 10 PK STRL BLUE (TOWEL DISPOSABLE) ×3 IMPLANT
TRAY FOLEY BAG SILVER LF 14FR (CATHETERS) IMPLANT
WATER STERILE IRR 1000ML POUR (IV SOLUTION) IMPLANT

## 2014-08-03 NOTE — Transfer of Care (Signed)
Immediate Anesthesia Transfer of Care Note  Patient: Bailey Solomon  Procedure(s) Performed: Procedure(s): RIGHT TOTAL KNEE ARTHROPLASTY (Right)  Patient Location: PACU  Anesthesia Type:General  Level of Consciousness: awake and patient cooperative  Airway & Oxygen Therapy: Patient Spontanous Breathing and Patient connected to face mask oxygen  Post-op Assessment: Report given to PACU RN, Post -op Vital signs reviewed and stable and Patient moving all extremities  Post vital signs: Reviewed and stable  Complications: No apparent anesthesia complications

## 2014-08-03 NOTE — Anesthesia Postprocedure Evaluation (Signed)
  Anesthesia Post-op Note  Patient: Bailey Solomon  Procedure(s) Performed: Procedure(s): RIGHT TOTAL KNEE ARTHROPLASTY (Right)  Patient Location: PACU  Anesthesia Type:GA combined with regional for post-op pain  Level of Consciousness: awake, oriented, sedated and patient cooperative  Airway and Oxygen Therapy: Patient Spontanous Breathing  Post-op Pain: mild  Post-op Assessment: Post-op Vital signs reviewed, Patient's Cardiovascular Status Stable, Respiratory Function Stable, Patent Airway, No signs of Nausea or vomiting and Pain level controlled  Post-op Vital Signs: stable  Last Vitals:  Filed Vitals:   08/03/14 1700  BP: 169/70  Pulse: 63  Temp: 36.4 C  Resp: 13    Complications: No apparent anesthesia complications

## 2014-08-03 NOTE — Progress Notes (Signed)
Orthopedic Tech Progress Note Patient Details:  Bailey Solomon June 23, 1941 [id number]  CPM Right Knee CPM Right Knee: On Right Knee Flexion (Degrees): 90 Right Knee Extension (Degrees): 0 Additional Comments: trapeze bar patient helper Ortho has no bone foam but will provide pt with it when it becomes available; rn notified  Nikki Dom 08/03/2014, 4:40 PM

## 2014-08-03 NOTE — Interval H&P Note (Signed)
History and Physical Interval Note:  08/03/2014 9:20 AM  Bailey Solomon  has presented today for surgery, with the diagnosis of OA RIGHT KNEE  The various methods of treatment have been discussed with the patient and family. After consideration of risks, benefits and other options for treatment, the patient has consented to  Procedure(s): RIGHT TOTAL KNEE ARTHROPLASTY (Right) as a surgical intervention .  The patient's history has been reviewed, patient examined, no change in status, stable for surgery.  I have reviewed the patient's chart and labs.  Questions were answered to the patient's satisfaction.     Raydan Schlabach, D

## 2014-08-03 NOTE — Anesthesia Preprocedure Evaluation (Addendum)
Anesthesia Evaluation  Patient identified by MRN, date of birth, ID band Patient awake    Reviewed: Allergy & Precautions, H&P , NPO status , Patient's Chart, lab work & pertinent test results  Airway Mallampati: III TM Distance: >3 FB Neck ROM: Full    Dental  (+) Dental Advisory Given, Edentulous Upper   Pulmonary shortness of breath and with exertion, former smoker,  breath sounds clear to auscultation        Cardiovascular hypertension, Rhythm:Regular Rate:Normal     Neuro/Psych PSYCHIATRIC DISORDERS Anxiety Depression negative neurological ROS     GI/Hepatic negative GI ROS, Neg liver ROS,   Endo/Other  Morbid obesity  Renal/GU negative Renal ROS     Musculoskeletal  (+) Arthritis -, Osteoarthritis,    Abdominal   Peds  Hematology negative hematology ROS (+)   Anesthesia Other Findings   Reproductive/Obstetrics negative OB ROS                         Anesthesia Physical Anesthesia Plan  ASA: III  Anesthesia Plan: General   Post-op Pain Management:    Induction: Intravenous  Airway Management Planned: Oral ETT  Additional Equipment: None  Intra-op Plan:   Post-operative Plan: Extubation in OR  Informed Consent: I have reviewed the patients History and Physical, chart, labs and discussed the procedure including the risks, benefits and alternatives for the proposed anesthesia with the patient or authorized representative who has indicated his/her understanding and acceptance.   Dental advisory given  Plan Discussed with: CRNA and Anesthesiologist  Anesthesia Plan Comments:        Anesthesia Quick Evaluation

## 2014-08-03 NOTE — Op Note (Signed)
DATE OF SURGERY:  08/03/2014 TIME: 3:07 PM  PATIENT NAME:  Bailey Solomon   AGE: 73 y.o.    PRE-OPERATIVE DIAGNOSIS:  OA RIGHT KNEE  POST-OPERATIVE DIAGNOSIS:  Same  PROCEDURE:  Procedure(s): RIGHT TOTAL KNEE ARTHROPLASTY   SURGEON:  Margarita Rana, D, MD   ASSISTANT:  Janace Litten OPA. He was necessary for an efficient case and appropriate visualization throughout the entire case.   OPERATIVE IMPLANTS: Stryker Triathlon Posterior Stabilized.  Femur size 3, Tibia size 3, Patella size 29 3-peg oval button, with a 9 mm polyethylene insert.   PREOPERATIVE INDICATIONS:  Bailey Solomon is a 73 y.o. year old female with end stage bone on bone degenerative arthritis of the knee who failed conservative treatment, including injections, antiinflammatories, activity modification, and assistive devices, and had significant impairment of their activities of daily living, and elected for Total Knee Arthroplasty.   The risks, benefits, and alternatives were discussed at length including but not limited to the risks of infection, bleeding, nerve injury, stiffness, blood clots, the need for revision surgery, cardiopulmonary complications, among others, and they were willing to proceed.   OPERATIVE DESCRIPTION:  The patient was brought to the operative room and placed in a supine position.  General anesthesia was administered.  IV antibiotics were given.  The lower extremity was prepped and draped in the usual sterile fashion.  Time out was performed.  The leg was elevated and exsanguinated and the tourniquet was inflated.  Anterior approach was performed.  The patella was everted and osteophytes were removed.  The anterior horn of the medial and lateral meniscus was removed.   The distal femur was opened with the drill and the intramedullary distal femoral cutting jig was utilized, set at 5 degrees resecting 8 mm off the distal femur.  Care was taken to protect the collateral ligaments.  The  distal femoral sizing jig was applied, taking care to avoid notching.  Then the 4-in-1 cutting jig was applied and the anterior and posterior femur was cut, along with the chamfer cuts.  All posterior osteophytes were removed.  The flexion gap was then measured and was symmetric with the extension gap.  Then the extramedullary tibial cutting jig was utilized making the appropriate cut using the anterior tibial crest as a reference building in appropriate posterior slope.  Care was taken during the cut to protect the medial and collateral ligaments.  The proximal tibia was removed along with the posterior horns of the menisci.  The PCL was sacrificed.    The extensor gap was measured and was approximately 9mm.    I completed the distal femoral preparation using the appropriate jig to prepare the box.  The patella was then measured, and cut with the saw.    The proximal tibia sized and prepared accordingly with the reamer and the punch, and then all components were trialed with the 9mm poly insert.  The knee was found to have excellent balance and full motion.    The above named components were then cemented into place and all excess cement was removed.  The real polyethylene implant was placed.  Exparel was injected in the soft tissue  The knee was easily taken through a range of motion and the patella tracked well and the knee irrigated copiously and the parapatellar and subcutaneous tissue closed with vicryl, and monocryl with steri strips for the skin.  The wounds were injected with exparrel, and dressed with sterile gauze and the tourniquet released and the patient  was awakened and returned to the PACU in stable and satisfactory condition.  There were no complications.  Total tourniquet time was 75 minutes.   POSTOPERATIVE PLAN: post op Abx, DVT px: SCD's, TED's, Early ambulation and ASA 325  This note was generated using a template and dragon dictation system. In light of that, I have  reviewed the note and all aspects of it are applicable to this case. Any dictation errors are due to the computerized dictation system.

## 2014-08-03 NOTE — Progress Notes (Signed)
Pt. Did not receive Tylenol p.o. Prior to sedation, Dr. Wandra Feinstein notified.

## 2014-08-03 NOTE — Anesthesia Procedure Notes (Addendum)
Anesthesia Regional Block:  Femoral nerve block  Pre-Anesthetic Checklist: ,, timeout performed, Correct Patient, Correct Site, Correct Laterality, Correct Procedure, Correct Position, site marked, Risks and benefits discussed,  Surgical consent,  Pre-op evaluation,  At surgeon's request and post-op pain management  Laterality: Right  Prep: chloraprep       Needles:  Injection technique: Single-shot  Needle Type: Echogenic Stimulator Needle     Needle Length: 9cm 9 cm Needle Gauge: 22 and 22 G    Additional Needles:  Procedures: ultrasound guided (picture in chart) and nerve stimulator Femoral nerve block  Nerve Stimulator or Paresthesia:  Response: quadraceps contraction, 0.6 mA,   Additional Responses:   Narrative:  Start time: 08/03/2014 1:25 PM End time: 08/03/2014 1:35 PM Injection made incrementally with aspirations every 5 mL.  Performed by: Personally  Anesthesiologist: Halford Decamp, MD  Additional Notes: Functioning IV was confirmed and monitors were applied.  A 90mm 22ga Arrow echogenic stimulator needle was used. Sterile prep and drape,hand hygiene and sterile gloves were used. Ultrasound guidance: relevant anatomy identified, needle position confirmed, local anesthetic spread visualized around nerve(s)., vascular puncture avoided.  Image printed for medical record. Negative aspiration and negative test dose prior to incremental administration of local anesthetic. The patient tolerated the procedure well.     Procedure Name: Intubation Date/Time: 08/03/2014 2:00 PM Performed by: Fransisca Kaufmann Pre-anesthesia Checklist: Patient identified, Emergency Drugs available, Suction available, Patient being monitored and Timeout performed Patient Re-evaluated:Patient Re-evaluated prior to inductionOxygen Delivery Method: Circle system utilized Preoxygenation: Pre-oxygenation with 100% oxygen Intubation Type: IV induction Ventilation: Mask ventilation without  difficulty Laryngoscope Size: Mac and 3 Grade View: Grade I Tube type: Oral Tube size: 7.5 mm Number of attempts: 1 Airway Equipment and Method: Stylet Placement Confirmation: ETT inserted through vocal cords under direct vision,  positive ETCO2 and breath sounds checked- equal and bilateral Secured at: 22 cm Tube secured with: Tape Dental Injury: Teeth and Oropharynx as per pre-operative assessment

## 2014-08-04 NOTE — Progress Notes (Signed)
Physical Therapy Treatment Patient Details Name: Bailey Solomon MRN: [id number] DOB: 05/24/1941 Today's Date: 08/04/2014    History of Present Illness s/p R TKA 08-03-14    PT Comments    Patient is progressing well with ambulation this session. Patient requesting to ambulate without KI as it is very aggravating her. Patient able to SLR and noted no buckling with ambulation. Patient is planning to DC home tomorrow. She has no stairs to enter her house.   Follow Up Recommendations  Home health PT;Supervision for mobility/OOB     Equipment Recommendations  Rolling walker with 5" wheels;3in1 (PT)    Recommendations for Other Services       Precautions / Restrictions Precautions Precautions: Knee;Fall Restrictions RLE Weight Bearing: Weight bearing as tolerated    Mobility  Bed Mobility Overal bed mobility: Needs Assistance Bed Mobility: Supine to Sit;Sit to Supine     Supine to sit: Supervision Sit to supine: Min assist   General bed mobility comments: use of bedrails. Min A for R LE back into bed  Transfers Overall transfer level: Needs assistance Equipment used: Rolling walker (2 wheeled)   Sit to Stand: Min guard         General transfer comment: verbal cues for hand placement. Minguard for safety  Ambulation/Gait Ambulation/Gait assistance: Min guard Ambulation Distance (Feet): 75 Feet Assistive device: Rolling walker (2 wheeled) Gait Pattern/deviations: Step-to pattern Gait velocity: decreased   General Gait Details: Cues for gait sequence and managment of RW. Patient adament about trying without KI as it is rubbing her too high and highly uncomfortable. Patient able to SLR without assitance and no buckling noted with ambulation.    Stairs            Wheelchair Mobility    Modified Rankin (Stroke Patients Only)       Balance                                    Cognition Arousal/Alertness: Awake/alert Behavior During  Therapy: WFL for tasks assessed/performed Overall Cognitive Status: Within Functional Limits for tasks assessed                      Exercises Total Joint Exercises Quad Sets: AROM;Right;10 reps Heel Slides: Right;10 reps;AROM Hip ABduction/ADduction: AROM;Right;10 reps Straight Leg Raises: AROM;Right;10 reps    General Comments        Pertinent Vitals/Pain      Home Living                      Prior Function            PT Goals (current goals can now be found in the care plan section) Progress towards PT goals: Progressing toward goals    Frequency  7X/week    PT Plan Current plan remains appropriate    Co-evaluation             End of Session Equipment Utilized During Treatment: Gait belt Activity Tolerance: Patient tolerated treatment well Patient left: in bed;with call bell/phone within reach     Time: 1419-1447 PT Time Calculation (min): 28 min  Charges:  $Gait Training: 8-22 mins $Therapeutic Exercise: 8-22 mins                    G Codes:      Fredrich Birks 08/04/2014, 3:05 PM  08/04/2014 Robinette,  Tonia Brooms PTA [phone] pager [phone] office

## 2014-08-04 NOTE — Plan of Care (Signed)
Problem: Consults Goal: Diagnosis- Total Joint Replacement Primary Total Knee     

## 2014-08-04 NOTE — Progress Notes (Signed)
Utilization review completed.  

## 2014-08-04 NOTE — Progress Notes (Signed)
Orthopedic Tech Progress Note Patient Details:  Bailey Solomon Jul 06, 1941 [id number] On cpm RLE 0-90 at 7:50 pm Patient ID: SUTTON PLAKE, female   DOB: 04-30-1941, 73 y.o.   MRN: [id number]   Jennye Moccasin 08/04/2014, 7:50 PM

## 2014-08-04 NOTE — Progress Notes (Signed)
Patient ID: Bailey Solomon, female   DOB: 15-Oct-1941, 73 y.o.   MRN: [id number]     Subjective:  Patient reports pain as mild.  Patient requesting to go home tomorrow sitting up in bed and eating breakfast.  In no acute distress.  Objective:   VITALS:   Filed Vitals:   08/04/14 0000 08/04/14 0100 08/04/14 0312 08/04/14 0546  BP:  159/56  181/82  Pulse:  65  64  Temp:  98.3 F (36.8 C)  97.6 F (36.4 C)  TempSrc:      Resp: Height:      Weight:      SpO2: 95% 91% 98% 97%    ABD soft Sensation intact distally Dorsiflexion/Plantar flexion intact Incision: dressing C/D/I and no drainage   Lab Results  Component Value Date   WBC 10.0 07/26/2014   HGB 14.8 07/26/2014   HCT 45.0 07/26/2014   MCV 85.4 07/26/2014   PLT 292 07/26/2014     Assessment/Plan: 1 Day Post-Op   Active Problems:   Arthritis of knee   Advance diet Up with therapy Plan for discharge tomorrow WBAT Dry dressing PRN Follow up with Dr Margarita Rana in Two weeks   Haskel Khan 08/04/2014, 7:55 AM   Margarita Rana MD [phone]

## 2014-08-04 NOTE — Progress Notes (Signed)
I participated in the care of this patient and agree with the above history, physical and evaluation. I performed a review of the history and a physical exam as detailed   Olla Delancey Daniel Forrest Jaroszewski MD  

## 2014-08-04 NOTE — Care Management Note (Signed)
CARE MANAGEMENT NOTE 08/04/2014  Patient:  Bailey Solomon, Bailey Solomon   Account Number:  [id number]  Date Initiated:  08/04/2014  Documentation initiated by:  Vance Peper  Subjective/Objective Assessment:   73 yr old female admitted with osteoarthritis of right knee, s/p right total knee arthroplasty.     Action/Plan:   Case manager spoke with patient and family concerning home health and DME needs at discharge. Patient preoperatively setup with [facility], no chages.   Anticipated DC Date:  08/05/2014   Anticipated DC Plan:  HOME W HOME HEALTH SERVICES      DC Planning Services  CM consult      [facility] Choice  HOME HEALTH  DURABLE MEDICAL EQUIPMENT   Choice offered to / List presented to:  C-1 Patient   DME arranged  [facility]  3-N-1  CPM      DME agency  [employer]     HH arranged  HH-2 PT      HH agency  [facility]   Status of service:  Completed, signed off Medicare Important Message given?  NA - LOS <3 / Initial given by admissions (If response is "NO", the following Medicare IM given date fields will be blank) Date Medicare IM given:   Medicare IM given by:   Date Additional Medicare IM given:   Additional Medicare IM given by:    Discharge Disposition:  HOME W HOME HEALTH SERVICES  Per UR Regulation:  Reviewed for med. necessity/level of care/duration of stay

## 2014-08-04 NOTE — Progress Notes (Signed)
Occupational Therapy Evaluation Patient Details Name: Bailey Solomon MRN: [id number] DOB: Feb 28, 1941 Today's Date: 08/04/2014    History of Present Illness Bailey Solomon is a 73 y.o. Female s/p R TKA on 08/03/14.    Clinical Impression   PTA pt was independent with ADLs and functional mobility. Pt reports that her son will be home but she has a strong desire to be independent. Pt educated with return demo of use of AE (hip kit) for LB ADLs. Pt is at min guard level for functional mobility and feel that she has no further acute OT needs.     Follow Up Recommendations  No OT follow up;Supervision/Assistance - 24 hour    Equipment Recommendations  3 in 1 bedside comode    Recommendations for Other Services       Precautions / Restrictions Precautions Precautions: Knee;Fall Required Braces or Orthoses: Knee Immobilizer - Right Knee Immobilizer - Right: On except when in CPM Restrictions Weight Bearing Restrictions: Yes RLE Weight Bearing: Weight bearing as tolerated      Mobility Bed Mobility Overal bed mobility: Needs Assistance Bed Mobility: Supine to Sit;Sit to Supine     Supine to sit: Supervision;HOB elevated Sit to supine: Supervision;HOB elevated   General bed mobility comments: Educated pt on use of leg lifter, however pt was able to return to bed with use of bedrails. No physical assist needed.   Transfers Overall transfer level: Needs assistance Equipment used: Rolling walker (2 wheeled) Transfers: Sit to/from Stand Sit to Stand: Min guard         General transfer comment: Min guard for safety. Pt with good hand placement and technique.         ADL Overall ADL's : Needs assistance/impaired Eating/Feeding: Independent;Sitting   Grooming: Min guard;Standing   Upper Body Bathing: Set up;Sitting   Lower Body Bathing: Min guard;With adaptive equipment;Sit to/from stand   Upper Body Dressing : Set up;Sitting   Lower Body Dressing: Min guard;With  adaptive equipment;Sit to/from stand Lower Body Dressing Details (indicate cue type and reason): Demonstrated use of Hip kit for LB dressing with pt return demo.  Toilet Transfer: Min guard;Ambulation;RW   Toileting- Water quality scientist and Hygiene: Min guard;Sit to/from Nurse, children's Details (indicate cue type and reason): educated pt on shower transfer technique and use of BSC in shower. Functional mobility during ADLs: Min guard;Rolling walker General ADL Comments: Pt educated on AE (hip kit) for LB ADLs and demonstrated use with accuracy for donning/doffing socks and underwear. Pt able to perform transfers at min guard level and has high desire for independence.      Vision  Pt reports no change from baseline and has no apparent deficits.                     Perception Perception Perception Tested?: No   Praxis Praxis Praxis tested?: Within functional limits    Pertinent Vitals/Pain Pain Assessment: No/denies pain     Hand Dominance Right   Extremity/Trunk Assessment Upper Extremity Assessment Upper Extremity Assessment: Overall WFL for tasks assessed (pt is wearing foam splint due to location of IV in wrist)   Lower Extremity Assessment Lower Extremity Assessment: Defer to PT evaluation   Cervical / Trunk Assessment Cervical / Trunk Assessment: Normal   Communication Communication Communication: No difficulties   Cognition Arousal/Alertness: Awake/alert Behavior During Therapy: WFL for tasks assessed/performed Overall Cognitive Status: Within Functional Limits for tasks assessed  Home Living Family/patient expects to be discharged to:: Private residence Living Arrangements: Children Available Help at Discharge: Family Type of Home: House Home Access: [place]: Able to live on main level with bedroom/bathroom     Bathroom Shower/Tub: Walk-in shower;Curtain    Bathroom Toilet: Standard     Home Equipment: Environmental consultant - 4 wheels;Wheelchair - manual;Cane - single point          Prior Functioning/Environment Level of Independence: Independent with assistive device(s)                                       End of Session Equipment Utilized During Treatment: Rolling walker CPM Right Knee CPM Right Knee: Off  Activity Tolerance: Patient tolerated treatment well Patient left: in bed;with call bell/phone within reach   Time: 1513-1611 OT Time Calculation (min): 58 min Charges:  OT General Charges $OT Visit: 1 Procedure OT Evaluation $Initial OT Evaluation Tier I: 1 Procedure OT Treatments $Self Care/Home Management : 38-52 mins  Juluis Rainier [phone] 08/04/2014, 5:17 PM

## 2014-08-04 NOTE — Evaluation (Signed)
Physical Therapy Evaluation Patient Details Name: Bailey Solomon MRN: [id number] DOB: 03-29-1941 Today's Date: 08/04/2014   History of Present Illness  s/p R TKA 08-03-14  Clinical Impression  Pt is s/p TKA resulting in the deficits listed below (see PT Problem List).  Pt will benefit from skilled PT to increase their independence and safety with mobility to allow discharge to the venue listed below. Mobility limited on eval by nausea and dizziness, suspected to be due to medication.  Pt demo good potential for quick progress to allow for d/c home tomorrow with HHPT.  She will need 3n1 and RW for home.     Follow Up Recommendations Home health PT;Supervision for mobility/OOB    Equipment Recommendations  Rolling walker with 5" wheels;3in1 (PT)    Recommendations for Other Services       Precautions / Restrictions Precautions Precautions: Knee;Fall Required Braces or Orthoses: Knee Immobilizer - Right Knee Immobilizer - Right: On except when in CPM Restrictions Weight Bearing Restrictions: Yes RLE Weight Bearing: Weight bearing as tolerated      Mobility  Bed Mobility Overal bed mobility: Needs Assistance Bed Mobility: Supine to Sit     Supine to sit: Min guard;HOB elevated     General bed mobility comments: use of bedrails  Transfers Overall transfer level: Needs assistance Equipment used: Rolling walker (2 wheeled) Transfers: Sit to/from [employer] Sit to Stand: Mod assist Stand pivot transfers: Min assist       General transfer comment: verbal cues for hand placement, sequencing  Ambulation/Gait Ambulation/Gait assistance: Min assist Ambulation Distance (Feet): 5 Feet Assistive device: Rolling walker (2 wheeled) Gait Pattern/deviations: Step-to pattern;Antalgic Gait velocity: decreased   General Gait Details: difficulty with weight shift over RLE  Stairs            Wheelchair Mobility    Modified Rankin (Stroke Patients Only)        Balance                                             Pertinent Vitals/Pain Pain Assessment: 0-10 Pain Score: 0-No pain Pain Intervention(s): Monitored during session    Home Living Family/patient expects to be discharged to:: Private residence Living Arrangements: Children Available Help at Discharge: Family Type of Home: House Home Access: Ramped entrance     Home Layout: Able to live on main level with bedroom/bathroom Home Equipment: Walker - 4 wheels;Wheelchair - manual;Cane - single point      Prior Function Level of Independence: Independent with assistive device(s)               Hand Dominance        Extremity/Trunk Assessment                         Communication   Communication: No difficulties  Cognition Arousal/Alertness: Awake/alert Behavior During Therapy: WFL for tasks assessed/performed Overall Cognitive Status: Within Functional Limits for tasks assessed                      General Comments      Exercises Total Joint Exercises Ankle Circles/Pumps: AROM;Both;10 reps Quad Sets: AROM;Right;10 reps Heel Slides: AAROM;Right;5 reps Goniometric ROM: 0-90 R knee AAROM      Assessment/Plan    PT Assessment Patient needs continued PT services  PT Diagnosis Difficulty  walking;Acute pain;Generalized weakness   PT Problem List Decreased strength;Decreased range of motion;Decreased activity tolerance;Decreased balance;Decreased mobility;Pain  PT Treatment Interventions DME instruction;Gait training;Functional mobility training;Therapeutic activities;Therapeutic exercise;Patient/family education;Balance training   PT Goals (Current goals can be found in the Care Plan section) Acute Rehab PT Goals Patient Stated Goal: home tomorrow PT Goal Formulation: With patient Time For Goal Achievement: 08/11/14 Potential to Achieve Goals: Good    Frequency 7X/week   Barriers to discharge         Co-evaluation               End of Session Equipment Utilized During Treatment: Gait belt Activity Tolerance: Treatment limited secondary to medical complications (Comment) (nausea and dizziness, suspected due to medication) Patient left: in chair;with call bell/phone within reach Nurse Communication: Mobility status         Time: 4782-9562 PT Time Calculation (min): 22 min   Charges:   PT Evaluation $Initial PT Evaluation Tier I: 1 Procedure PT Treatments $Therapeutic Activity: 8-22 mins   PT G Codes:          Ilda Foil 08/04/2014, 10:46 AM

## 2014-08-05 ENCOUNTER — Encounter (HOSPITAL_COMMUNITY): Payer: Self-pay | Admitting: Orthopedic Surgery

## 2014-08-05 NOTE — Discharge Instructions (Signed)
Bear weight as tolerated  Keep incision covered and dry

## 2014-08-05 NOTE — Progress Notes (Signed)
Physical Therapy Treatment Patient Details Name: Bailey Solomon MRN: [id number] DOB: 1941/03/03 Today's Date: 08/05/2014    History of Present Illness Bailey Solomon is a 73 y.o. Female s/p R TKA on 08/03/14.     PT Comments    Patient progressing well this AM. She has a wheelchair and ramp to enter her house. Patient safe to D/C from a mobility standpoint based on progression towards goals set on PT eval.    Follow Up Recommendations  Home health PT;Supervision for mobility/OOB     Equipment Recommendations  Rolling walker with 5" wheels;3in1 (PT)    Recommendations for Other Services       Precautions / Restrictions Precautions Precautions: Knee;Fall Restrictions Weight Bearing Restrictions: Yes RLE Weight Bearing: Weight bearing as tolerated    Mobility  Bed Mobility               General bed mobility comments: Patient in recliner upon entering room.  Transfers Overall transfer level: Needs assistance Equipment used: Rolling walker (2 wheeled)   Sit to Stand: Supervision         General transfer comment: Supervision for safety  Ambulation/Gait Ambulation/Gait assistance: Supervision Ambulation Distance (Feet): 80 Feet   Gait Pattern/deviations: Step-to pattern Gait velocity: decreased   General Gait Details: Patient with correct sequence. Required some rest breaks and cues not to lean over on RW.    Stairs            Wheelchair Mobility    Modified Rankin (Stroke Patients Only)       Balance                                    Cognition Arousal/Alertness: Awake/alert Behavior During Therapy: WFL for tasks assessed/performed Overall Cognitive Status: Within Functional Limits for tasks assessed                      Exercises Total Joint Exercises Quad Sets: AROM;Right;15 reps Heel Slides: Right;AROM;15 reps Hip ABduction/ADduction: AROM;Right;15 reps Straight Leg Raises: AROM;Right;15 reps Long Arc  Quad: AROM;Right;15 reps Goniometric ROM: 0-95 r knee AROM    General Comments        Pertinent Vitals/Pain Pain Assessment: No/denies pain    Home Living                      Prior Function            PT Goals (current goals can now be found in the care plan section) Progress towards PT goals: Progressing toward goals    Frequency  7X/week    PT Plan Current plan remains appropriate    Co-evaluation             End of Session Equipment Utilized During Treatment: Gait belt Activity Tolerance: Patient tolerated treatment well Patient left: in chair;with call bell/phone within reach     Time: 0820-0849 PT Time Calculation (min): 29 min  Charges:  $Gait Training: 8-22 mins $Therapeutic Exercise: 8-22 mins                    G Codes:      Bailey Solomon 08/05/2014, 9:00 AM 08/05/2014 Bailey Solomon PTA [phone] pager [phone] office

## 2014-08-05 NOTE — Discharge Summary (Signed)
Physician Discharge Summary  Patient ID: Bailey Solomon MRN: [id number] DOB/AGE: 1941/05/10 73 y.o.  Admit date: 08/03/2014 Discharge date: 08/05/2014  Admission Diagnoses:  <principal problem not specified>  Discharge Diagnoses:  Active Problems:   Arthritis of knee   Past Medical History  Diagnosis Date  . Hypertension   . Obesity   . Anxiety   . Depression   . Shingles   . Exertional shortness of breath   . Chronic bronchitis     "quit after I stopped smoking"  . Arthritis     "qwhere"    Surgeries: Procedure(s): RIGHT TOTAL KNEE ARTHROPLASTY on 08/03/2014   Consultants (if any):    Discharged Condition: Improved  Hospital Course: MAKIAH Solomon is an 73 y.o. female who was admitted 08/03/2014 with a diagnosis of <principal problem not specified> and went to the operating room on 08/03/2014 and underwent the above named procedures.    She was given perioperative antibiotics:  Anti-infectives   Start     Dose/Rate Route Frequency Ordered Stop   08/03/14 2000  ceFAZolin (ANCEF) IVPB 2 g/50 mL premix     2 g 100 mL/hr over 30 Minutes Intravenous Every 6 hours 08/03/14 1741 08/04/14 0341   08/03/14 1142  ceFAZolin (ANCEF) 2-3 GM-% IVPB SOLR    Comments:  Elliott, Beth   : cabinet override      08/03/14 1142 08/03/14 2344   08/03/14 0600  ceFAZolin (ANCEF) IVPB 2 g/50 mL premix     2 g 100 mL/hr over 30 Minutes Intravenous On call to O.R. 08/02/14 1332 08/03/14 1405    .  She was given sequential compression devices, early ambulation, and ASA 325 daily for DVT prophylaxis.  She benefited maximally from the hospital stay and there were no complications.    Recent vital signs:  Filed Vitals:   08/05/14 0551  BP: 144/68  Pulse: 88  Temp: 97.8 F (36.6 C)  Resp: 16    Recent laboratory studies:  Lab Results  Component Value Date   HGB 14.8 07/26/2014   HGB 13.6 11/28/2011   Lab Results  Component Value Date   WBC 10.0 07/26/2014   PLT 292 07/26/2014    Lab Results  Component Value Date   INR 1.16 07/26/2014   Lab Results  Component Value Date   NA 140 07/26/2014   K 4.2 07/26/2014   CL 102 07/26/2014   CO2 23 07/26/2014   BUN 14 07/26/2014   CREATININE 0.63 07/26/2014   GLUCOSE 131* 07/26/2014    Discharge Medications:     Medication List         aspirin EC 325 MG tablet  Take 1 tablet (325 mg total) by mouth daily.     celecoxib 200 MG capsule  Commonly known as:  CELEBREX  Take 1 capsule (200 mg total) by mouth 2 (two) times daily.     cromolyn 5.2 MG/ACT nasal spray  Commonly known as:  NASALCROM  Place 1 spray into both nostrils 2 (two) times daily as needed for allergies.     diazepam 5 MG tablet  Commonly known as:  VALIUM  Take 2.5 mg by mouth 2 (two) times a week.     docusate sodium 100 MG capsule  Commonly known as:  COLACE  Take 1 capsule (100 mg total) by mouth 2 (two) times daily. Continue this while taking narcotics to help with bowel movements     ibuprofen 200 MG tablet  Commonly known as:  ADVIL,MOTRIN  Take 400 mg by mouth every 6 (six) hours as needed (for pain).     losartan 100 MG tablet  Commonly known as:  COZAAR  Take 50 mg by mouth daily.     oxyCODONE-acetaminophen 5-325 MG per tablet  Commonly known as:  ROXICET  Take 2 tablets by mouth every 4 (four) hours as needed.        Diagnostic Studies: Dg Chest 2 View  07/09/2014   CLINICAL DATA:  Cough, shortness of breath  EXAM: CHEST  2 VIEW  COMPARISON:  Chest radiograph 02/17/2009  FINDINGS: Stable enlarged cardiac and mediastinal contours. No consolidative pulmonary opacities. No pleural effusion or pneumothorax. Focal eventration right hemidiaphragm. Mid thoracic spine degenerative changes.  IMPRESSION: No acute cardiopulmonary process.   Electronically Signed   By: Annia Belt M.D.   On: 07/09/2014 08:50   Dg Knee 1-2 Views Right  08/03/2014   CLINICAL DATA:  post op  EXAM: RIGHT KNEE - 1-2 VIEW  COMPARISON:  Four views of the knee on  10/12/2008  FINDINGS: The patient has undergone total knee arthroplasty. Alignment is normal. No acute fracture. Joint effusion and soft tissue gas are noted.  IMPRESSION: Status post total knee arthroplasty.  No adverse features identified   Electronically Signed   By: Rosalie Gums M.D.   On: 08/03/2014 16:39    Disposition: 01-Home or Self Care        Follow-up Information   Follow up with [facility]. (Someone from [facility] will contact you concerning start date and time for physical therapy.)    Contact information:   [address] [place] [address] [phone]        Signed: Margarita Rana, D 08/05/2014, 7:57 AM

## 2014-09-16 ENCOUNTER — Encounter: Payer: Self-pay | Admitting: *Deleted

## 2014-09-16 ENCOUNTER — Encounter: Payer: PRIVATE HEALTH INSURANCE | Attending: Family Medicine | Admitting: *Deleted

## 2014-09-16 DIAGNOSIS — Z713 Dietary counseling and surveillance: Secondary | ICD-10-CM | POA: Diagnosis not present

## 2014-09-16 DIAGNOSIS — R739 Hyperglycemia, unspecified: Secondary | ICD-10-CM | POA: Diagnosis not present

## 2014-09-16 DIAGNOSIS — I10 Essential (primary) hypertension: Secondary | ICD-10-CM | POA: Diagnosis not present

## 2014-09-16 DIAGNOSIS — E785 Hyperlipidemia, unspecified: Secondary | ICD-10-CM | POA: Diagnosis not present

## 2014-09-16 DIAGNOSIS — Z6841 Body Mass Index (BMI) 40.0 and over, adult: Secondary | ICD-10-CM | POA: Diagnosis not present

## 2014-09-16 DIAGNOSIS — E669 Obesity, unspecified: Secondary | ICD-10-CM | POA: Diagnosis present

## 2014-09-16 NOTE — Progress Notes (Signed)
  Medical Nutrition Therapy:  Appt start time: 1100 end time:  1200.  Assessment:  Primary concerns today: Patient is here for nutrition counseling pertaining to prediabetes, obesity, hyperlipidemia, and HTN.  She states she has made many dietary changes since her visit with her PCP: States she has cut out sugars and wheat and cheese, she has also cut out some starches like breads, potatoes, rice.  She states she eats no processed foods (but she does eat bacon and sausage).  She tries to grill more often.  She states her doctor gave her some guidance on what to eat, but she has some conflicting information on what foods affect glucose and blood lipids.   She lives at home with her adult son, but he is not that helpful.  She had knee surgery not long ago and is not able to move much.    Preferred Learning Style:  Auditory- needs to have things read to her  Learning Readiness:   Change in progress  MEDICATIONS: see list.  Takes antihypertensive, but nothing for cholesterol or glucose   DIETARY INTAKE:  Usual eating pattern includes 3 meals and 1-2 snacks per day.  Everyday foods include lean proteins, vegetables, fruits, and some starches.  Avoided foods include sweets and fatty meats.    24-hr recall:  B ( AM): coffee with piece of bread plain, couple of scrambled eggs with onions and chives  Snk ( AM): bananas, oragnes  L ( PM): salad (light balsamic dressing) with chicken or fish Snk ( PM): bananas, oranges, peanut butter and crackers. Maybe some cheese D ( PM): chicken, fish or hamburger with vegetables (green beans or collards or kale) Snk ( PM): not usually Beverages: coffee  Usual physical activity: housework, physical therapy sometimes.  States she is too busy to do her PT exercises  Estimated energy needs: 1400 calories 158 g carbohydrates 105 g protein 39 g fat    Nutritional Diagnosis:  NB-1.1 Food and nutrition-related knowledge deficit As related to proper balance  of fats, carbohydrates, and proteins necessary for blood sugar and blood lipid control.  As evidenced by altered nutrition lab data.    Intervention:  Nutrition counseling provided.  Discussed MyPlate recommendation for meal planning for prediabetes: decreased carbohydrates, and increased non-starchy vegetables.  Discussed what foods contain carbohydrates and therefore affect blood sugar levels.  Recommended 30g carb/meal.  Also discussed Therapeutic Lifestyle dietary changes for her blood lipids: limit saturated and trans fats and increase unsaturated fats.  Discussed low-sodium seasoning.  Teaching Method Utilized: Visual Auditory  Handouts given during visit include:  MyPlate for Diabetes  Meal plan card  Low sodium seasoning  TLC handout  30g sample diet handout  Barriers to learning/adherence to lifestyle change: limited mobility, perception of busy schedule  Demonstrated degree of understanding via:  Teach Back   Monitoring/Evaluation:  Dietary intake, exercise, lab data, and body weight prn.

## 2014-10-05 ENCOUNTER — Encounter (HOSPITAL_BASED_OUTPATIENT_CLINIC_OR_DEPARTMENT_OTHER): Payer: PRIVATE HEALTH INSURANCE

## 2015-11-25 ENCOUNTER — Other Ambulatory Visit: Payer: Self-pay | Admitting: General Surgery

## 2016-02-10 ENCOUNTER — Other Ambulatory Visit: Payer: Self-pay

## 2016-02-10 DIAGNOSIS — Z1239 Encounter for other screening for malignant neoplasm of breast: Secondary | ICD-10-CM

## 2016-02-14 ENCOUNTER — Other Ambulatory Visit: Payer: Self-pay

## 2016-02-14 DIAGNOSIS — Z1231 Encounter for screening mammogram for malignant neoplasm of breast: Secondary | ICD-10-CM

## 2016-02-21 ENCOUNTER — Ambulatory Visit
Admission: RE | Admit: 2016-02-21 | Discharge: 2016-02-21 | Disposition: A | Discharge status: Home / Self Care | Payer: Medicare Other | Source: Ambulatory Visit

## 2016-02-21 DIAGNOSIS — Z1231 Encounter for screening mammogram for malignant neoplasm of breast: Secondary | ICD-10-CM

## 2017-01-17 ENCOUNTER — Ambulatory Visit
Admission: RE | Admit: 2017-01-17 | Discharge: 2017-01-17 | Disposition: A | Discharge status: Home / Self Care | Payer: Medicare Other | Source: Ambulatory Visit | Attending: Family Medicine | Admitting: Family Medicine

## 2017-01-17 ENCOUNTER — Other Ambulatory Visit: Payer: Self-pay | Admitting: Family Medicine

## 2017-01-17 DIAGNOSIS — J40 Bronchitis, not specified as acute or chronic: Secondary | ICD-10-CM

## 2017-01-22 ENCOUNTER — Other Ambulatory Visit: Payer: Self-pay | Admitting: Family Medicine

## 2017-01-22 DIAGNOSIS — R9389 Abnormal findings on diagnostic imaging of other specified body structures: Secondary | ICD-10-CM

## 2017-01-28 ENCOUNTER — Other Ambulatory Visit: Payer: Medicare Other

## 2017-01-29 ENCOUNTER — Ambulatory Visit
Admission: RE | Admit: 2017-01-29 | Discharge: 2017-01-29 | Disposition: A | Discharge status: Home / Self Care | Payer: Medicare Other | Source: Ambulatory Visit | Attending: Family Medicine | Admitting: Family Medicine

## 2017-01-29 DIAGNOSIS — R9389 Abnormal findings on diagnostic imaging of other specified body structures: Secondary | ICD-10-CM

## 2017-01-29 MED ORDER — IOPAMIDOL (ISOVUE-300) INJECTION 61%: 75.0000 mL | Freq: Once | INTRAVENOUS | Status: DC | PRN

## 2017-06-29 ENCOUNTER — Emergency Department (HOSPITAL_COMMUNITY)
Admission: EM | Admit: 2017-06-29 | Discharge: 2017-06-29 | Disposition: A | Discharge status: Home / Self Care | Payer: Medicare Other | Attending: Emergency Medicine | Admitting: Emergency Medicine

## 2017-06-29 ENCOUNTER — Encounter (HOSPITAL_COMMUNITY): Payer: Self-pay | Admitting: *Deleted

## 2017-06-29 DIAGNOSIS — Z87891 Personal history of nicotine dependence: Secondary | ICD-10-CM | POA: Diagnosis not present

## 2017-06-29 DIAGNOSIS — Z79899 Other long term (current) drug therapy: Secondary | ICD-10-CM | POA: Diagnosis not present

## 2017-06-29 DIAGNOSIS — I10 Essential (primary) hypertension: Secondary | ICD-10-CM

## 2017-06-29 LAB — URINALYSIS, ROUTINE W REFLEX MICROSCOPIC
Bilirubin Urine: NEGATIVE
Glucose, UA: NEGATIVE mg/dL
Hgb urine dipstick: NEGATIVE
Ketones, ur: NEGATIVE mg/dL
LEUKOCYTES UA: NEGATIVE
Nitrite: NEGATIVE
PROTEIN: NEGATIVE mg/dL
SPECIFIC GRAVITY, URINE: 1.003 — AB (ref 1.005–1.030)
pH: 7 (ref 5.0–8.0)

## 2017-06-29 LAB — CBC WITH DIFFERENTIAL/PLATELET
BASOS ABS: 0.1 10*3/uL (ref 0.0–0.1)
BASOS PCT: 1 %
Eosinophils Absolute: 0.3 10*3/uL (ref 0.0–0.7)
Eosinophils Relative: 3 %
HCT: 43.2 % (ref 36.0–46.0)
Hemoglobin: 14.2 g/dL (ref 12.0–15.0)
Lymphocytes Relative: 28 %
Lymphs Abs: 2.4 10*3/uL (ref 0.7–4.0)
MCH: 27.8 pg (ref 26.0–34.0)
MCHC: 32.9 g/dL (ref 30.0–36.0)
MCV: 84.5 fL (ref 78.0–100.0)
MONO ABS: 0.6 10*3/uL (ref 0.1–1.0)
Monocytes Relative: 7 %
Neutro Abs: 5.4 10*3/uL (ref 1.7–7.7)
Neutrophils Relative %: 61 %
PLATELETS: 282 10*3/uL (ref 150–400)
RBC: 5.11 MIL/uL (ref 3.87–5.11)
RDW: 13.3 % (ref 11.5–15.5)
WBC: 8.7 10*3/uL (ref 4.0–10.5)

## 2017-06-29 LAB — COMPREHENSIVE METABOLIC PANEL
ALT: 14 U/L (ref 14–54)
AST: 18 U/L (ref 15–41)
Albumin: 4.1 g/dL (ref 3.5–5.0)
Alkaline Phosphatase: 70 U/L (ref 38–126)
Anion gap: 9 (ref 5–15)
BUN: 16 mg/dL (ref 6–20)
CHLORIDE: 103 mmol/L (ref 101–111)
CO2: 25 mmol/L (ref 22–32)
Calcium: 9.6 mg/dL (ref 8.9–10.3)
Creatinine, Ser: 0.64 mg/dL (ref 0.44–1.00)
GFR calc Af Amer: >60 mL/min (ref >60)
GFR calc non Af Amer: >60 mL/min (ref >60)
Glucose, Bld: 103 mg/dL — ABNORMAL HIGH (ref 65–99)
Potassium: 4.1 mmol/L (ref 3.5–5.1)
Sodium: 137 mmol/L (ref 135–145)
Total Bilirubin: 0.6 mg/dL (ref 0.3–1.2)
Total Protein: 6.9 g/dL (ref 6.5–8.1)

## 2017-06-29 LAB — I-STAT TROPONIN, ED: Troponin i, poc: 0 ng/mL (ref 0.00–0.08)

## 2017-06-29 MED ORDER — LISINOPRIL 20 MG PO TABS
20.0000 mg | ORAL_TABLET | Freq: Once | ORAL | Status: AC
  Administered 2017-06-29: 20 mg via ORAL
  Filled 2017-06-29: qty 1

## 2017-06-29 MED ORDER — AMLODIPINE BESYLATE 5 MG PO TABS
5.0000 mg | ORAL_TABLET | Freq: Once | ORAL | Status: AC
  Administered 2017-06-29: 5 mg via ORAL
  Filled 2017-06-29: qty 1

## 2017-06-29 MED ORDER — AMLODIPINE BESYLATE 5 MG PO TABS: 5.0000 mg | ORAL_TABLET | Freq: Once | ORAL | Status: DC

## 2017-06-29 MED ORDER — AMLODIPINE BESYLATE 5 MG PO TABS: 10.0000 mg | ORAL_TABLET | Freq: Once | ORAL | Status: DC

## 2017-06-29 NOTE — ED Notes (Signed)
Walked patient to the bathroom patient did well patient is back into bed with family at bedside

## 2017-06-29 NOTE — ED Triage Notes (Signed)
Pt reports having HTN despite taking all her meds as prescribed. bp is 181/114 at triage. Reports mild discomfort in her head, no distress is noted at triage.

## 2017-06-29 NOTE — ED Provider Notes (Signed)
[facility]-EMERGENCY DEPT Provider Note   CSN: [id number] Arrival date & time: 06/29/17  1204     History   Chief Complaint Chief Complaint  Patient presents with  . Hypertension    HPI [facility] is a 76 y.o. female.  Patient with history of HTN, HLD, Chronic Bronchitis, and anxiety presents with hypertension into the 180s-200s systolic for the past several weeks. She saw here primary care provider on Wednesday and he added Amplodipine 2.5mg  daily to her Lisinopril 20mg  Daily. She was instructed to increase her doses as needed up to 40mg  Lisnopril daily and 2.5mg  amplodipine daily as needed if her blood pressure did not come down. She continued to have high blood pressure this morning and felt pulsations in her temples. She did not feel comfortable with simply adjusting her medications at home with blood pressures so high. She denies headache, dizziness, or changes in vision.      Past Medical History:  Diagnosis Date  . Anxiety   . Arthritis    "qwhere"  . Chronic bronchitis (HCC)    "quit after I stopped smoking"  . Depression   . Exertional shortness of breath   . Hyperlipidemia   . Hypertension   . Obesity   . Prediabetes   . Shingles     Patient Active Problem List   Diagnosis Date Noted  . Arthritis of knee 08/03/2014    Past Surgical History:  Procedure Laterality Date  . JOINT REPLACEMENT    . TONSILLECTOMY    . 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 TUBAL LIGATION      OB History    No data available       Home Medications    Prior to Admission medications   Medication Sig Start Date End Date Taking? Authorizing Provider  amLODipine (NORVASC) 5 MG tablet Take 2.5-10 mg by mouth daily. 06/25/17  Yes [provider]  ARNICA EX Apply 1 application topically  as needed (Arthritis Pain in fingers).   Yes [provider]  cromolyn (NASALCROM) 5.2 MG/ACT nasal spray Place 1 spray into both nostrils daily as needed for allergies.    Yes [provider]  Lactobacillus (ACIDOPHILUS PO) Take 1 tablet by mouth daily.   Yes [provider]  lisinopril (PRINIVIL,ZESTRIL) 20 MG tablet Take 10-40 mg by mouth daily.   Yes [provider]  loratadine (CLARITIN) 10 MG tablet Take 10 mg by mouth daily.   Yes [provider]  NAPROXEN PO Take 1-2 tablets by mouth as needed (Pain).   Yes [provider]  aspirin EC 325 MG tablet Take 1 tablet (325 mg total) by mouth daily. Patient not taking: Reported on 06/29/2017 08/03/14   Sheral ApleyMurphy, Timothy D, MD  celecoxib (CELEBREX) 200 MG capsule Take 1 capsule (200 mg total) by mouth 2 (two) times daily. Patient not taking: Reported on 06/29/2017 08/03/14   Sheral ApleyMurphy, Timothy D, MD  docusate sodium (COLACE) 100 MG capsule Take 1 capsule (100 mg total) by mouth 2 (two) times daily. Continue this while taking narcotics to help with bowel movements Patient not taking: Reported on 06/29/2017 08/03/14   Sheral ApleyMurphy, Timothy D, MD  losartan (COZAAR) 100 MG tablet Take 50 mg by mouth daily.    [provider]  oxyCODONE-acetaminophen (ROXICET) 5-325 MG per tablet Take 2  tablets by mouth every 4 (four) hours as needed. Patient not taking: Reported on 06/29/2017 08/03/14   Sheral ApleyMurphy, Timothy D, MD    Family History Family History  Problem Relation Age of Onset  . Asthma Other   . Cancer Other   . Hypertension Other   . Heart attack Other     Social History Social History  Substance Use Topics  . Smoking status: Former Smoker    Packs/day: 3.00    Years: 19.00    Types: Cigarettes  . Smokeless tobacco: Never Used     Comment: "quit smoking in fall 1975"  . Alcohol use Yes     Comment: 08/03/2014 "glass of wine a couple times/month"     Allergies   Aspirin; Codeine; and  Gelatin   Review of Systems Review of Systems   Physical Exam Updated Vital Signs BP (!) 166/93   Pulse 60   Temp 97.9 F (36.6 C) (Oral)   Resp 13   SpO2 96%   Physical Exam   ED Treatments / Results  Labs (all labs ordered are listed, but only abnormal results are displayed) Labs Reviewed  COMPREHENSIVE METABOLIC PANEL - Abnormal; Notable for the following:       Result Value   Glucose, Bld 103 (*)    All other components within normal limits  URINALYSIS, ROUTINE W REFLEX MICROSCOPIC - Abnormal; Notable for the following:    Specific Gravity, Urine 1.003 (*)    All other components within normal limits  CBC WITH DIFFERENTIAL/PLATELET  I-STAT TROPONIN, ED    EKG  EKG Interpretation  Date/Time:  Saturday June 29 2017 12:24:58 EDT Ventricular Rate:  58 PR Interval:    QRS Duration: 95 QT Interval:  426 QTC Calculation: 419 R Axis:   -22 Text Interpretation:  Sinus rhythm Atrial premature complex Borderline left axis deviation Low voltage, precordial leads No significant change since last tracing Confirmed by Richardean CanalYao, David H [phone] on 06/29/2017 12:28:19 PM       Radiology No results found.  Procedures Procedures (including critical care time)  Medications Ordered in ED Medications  lisinopril (PRINIVIL,ZESTRIL) tablet 20 mg (20 mg Oral Given 06/29/17 1336)  amLODipine (NORVASC) tablet 5 mg (5 mg Oral Given 06/29/17 1336)     Initial Impression / Assessment and Plan / ED Course  I have reviewed the triage vital signs and the nursing notes.  Pertinent labs & imaging results that were available during my care of the patient were reviewed by me and considered in my medical decision making (see chart for details).     Patient with hypertension into the 180s-200s systolic for the past several weeks with recent medication adjustment and instruction to increase doses as need, but has not increased her doses yet. - CBC, CMP, Troponin. U/A: No significant  derrangements - Lisinopril increased to 40mg  Daily and Amlodipine to 5mg  Daily  Blood pressure has come down with increase in home medications. Will discharge on Lisinopril 40mg  Daily and Amlodipine to 5mg  Daily.  Final Clinical Impressions(s) / ED Diagnoses   Final diagnoses:  None    New Prescriptions New Prescriptions   No medications on file     Beola CordMelvin, Alexander, MD 06/29/17 1507    Charlynne PanderYao, David Hsienta, MD 07/01/17 [phone]

## 2017-06-29 NOTE — Discharge Instructions (Signed)
Your increased blood pressure improved with higher doses of your home medications.  Please change the way you take your home medications as follows: - Lisinopril 40mg  Daily - Amlodipine 5mg  Daily  Seek medical attention if you experience severe symptoms.

## 2017-12-20 IMAGING — CT CT CHEST W/ CM
1 series · 15 of 34 positions shown, 19 images · IV contrast (APPLIED)
Comparison: 01/17/2017

CLINICAL DATA: Follow-up abnormal chest x-ray

EXAM:
CT CHEST WITH CONTRAST
TECHNIQUE: Multidetector CT imaging of the chest was performed during
intravenous contrast administration.
CONTRAST:  75 mL Isovue 370.
Creatinine was obtained on site at [HOSPITAL] at [HOSPITAL].
Results: Creatinine 0.7 mg/dL.

[Series 2: chest w/cm · axial · 0.78mm/px · z∈[-360,-76]mm · 15 of 168 slices shown, 19 images]
[im 13/168  mediastinal]
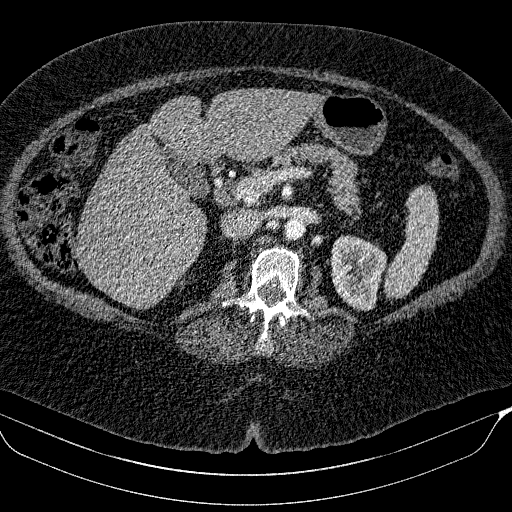
[im 13/168  lung]
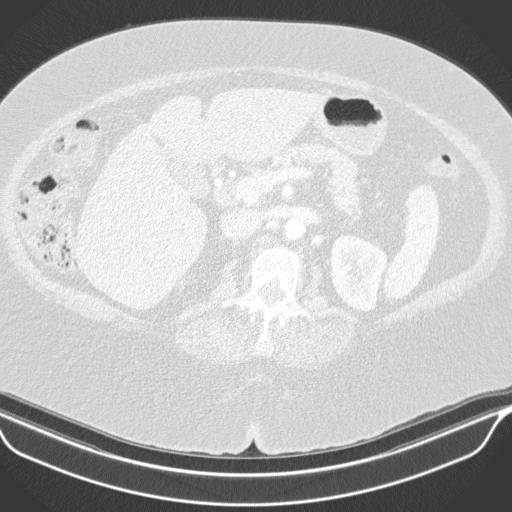
[im 25/168  lung]
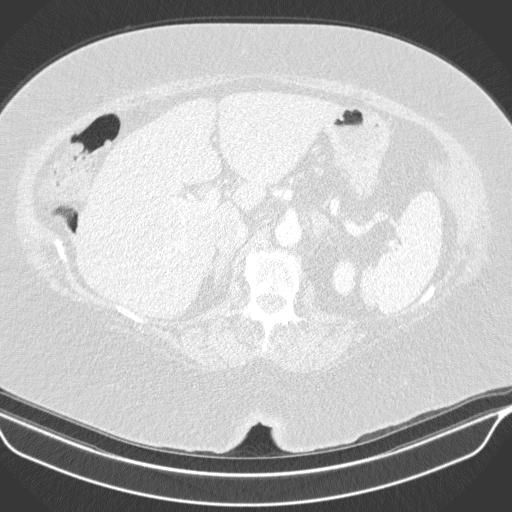
[im 34/168  lung]
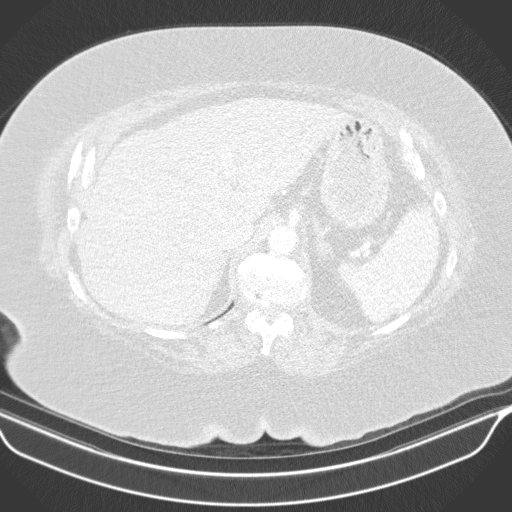
[im 44/168  lung]
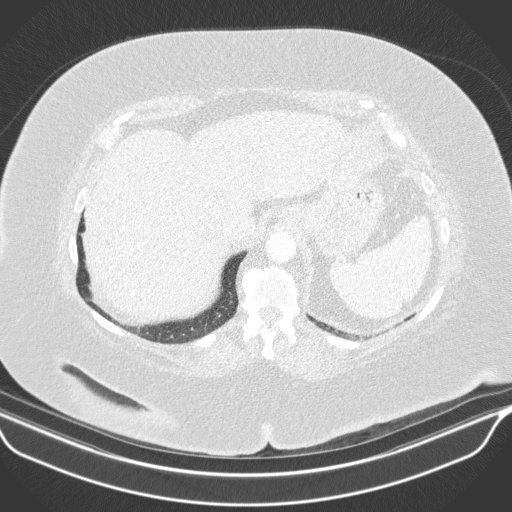
[im 56/168  mediastinal]
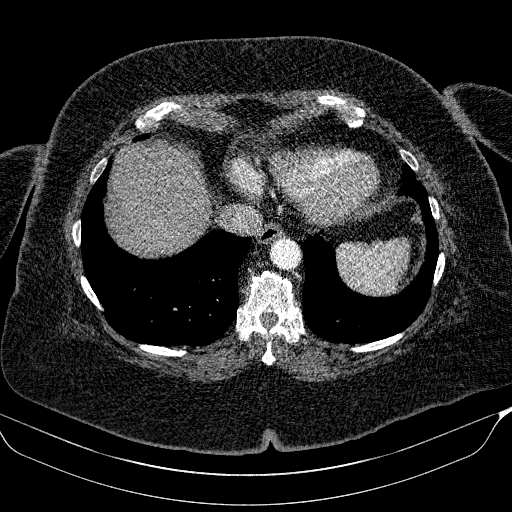
[im 56/168  lung]
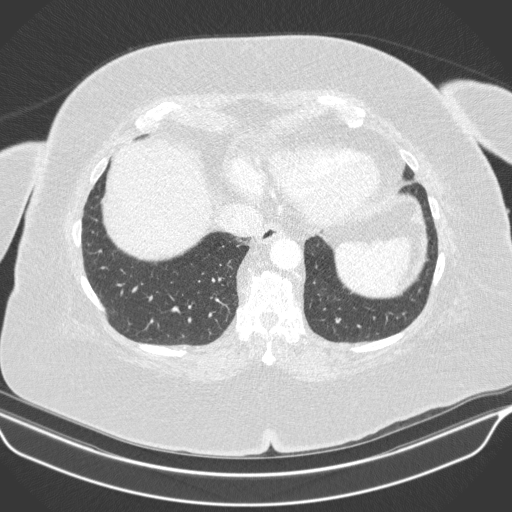
[im 67/168  lung]
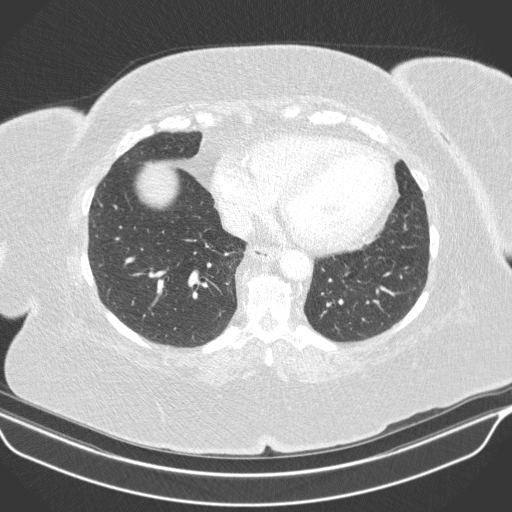
[im 75/168  lung]
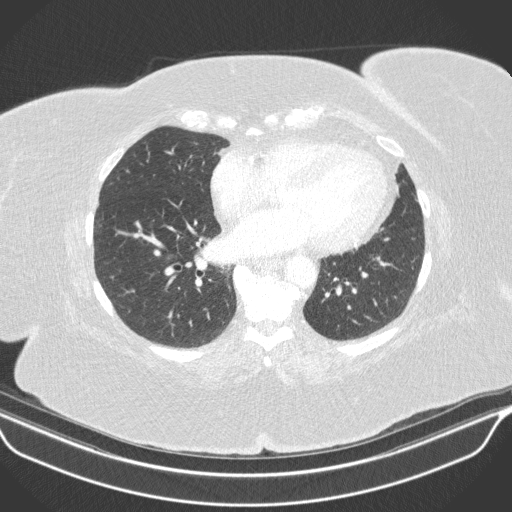
[im 87/168  lung]
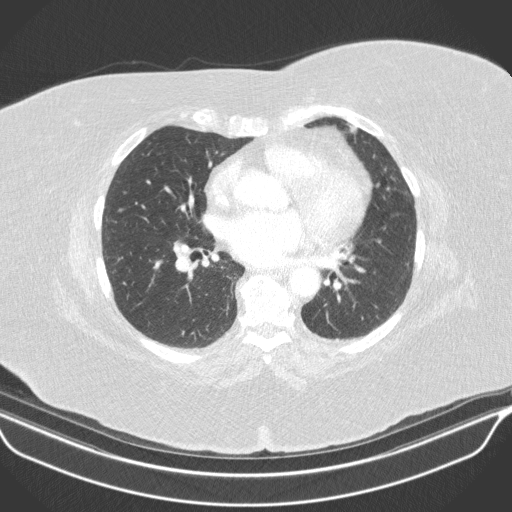
[im 93/168  mediastinal]
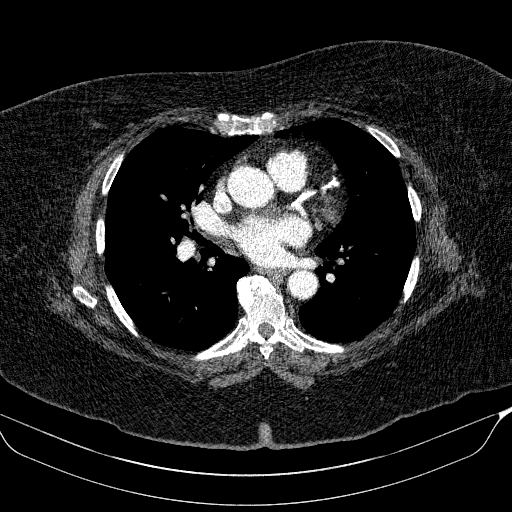
[im 93/168  lung]
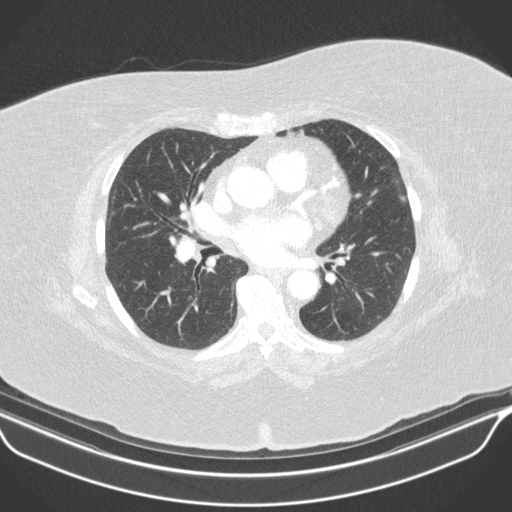
[im 101/168  lung]
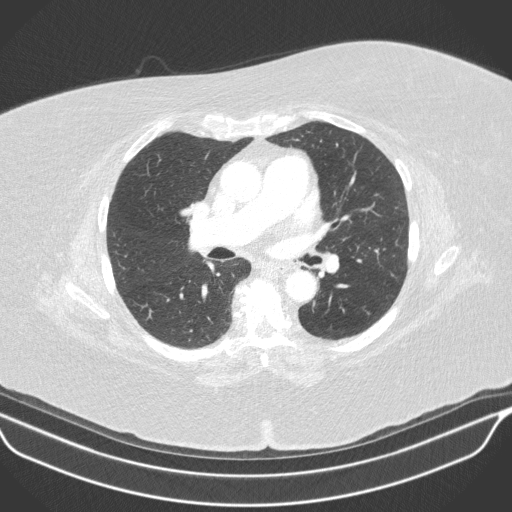
[im 112/168  lung]
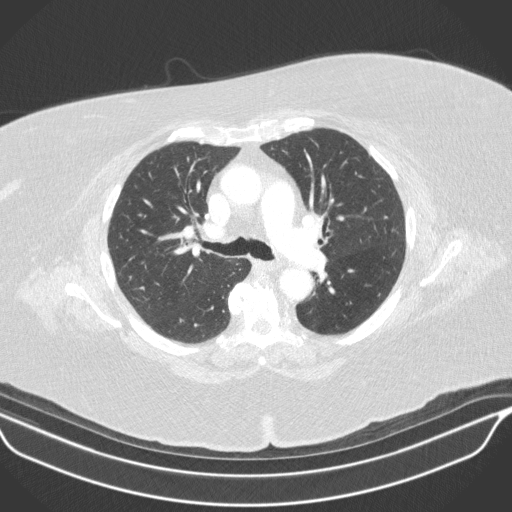
[im 124/168  lung]
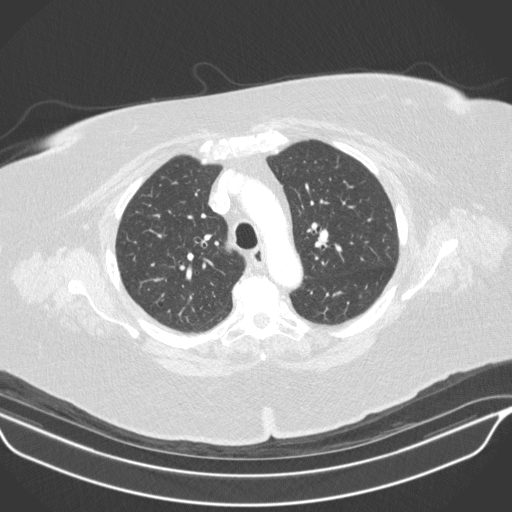
[im 134/168  mediastinal]
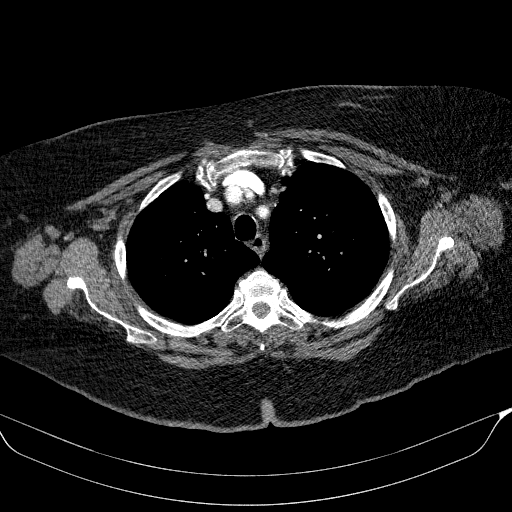
[im 134/168  lung]
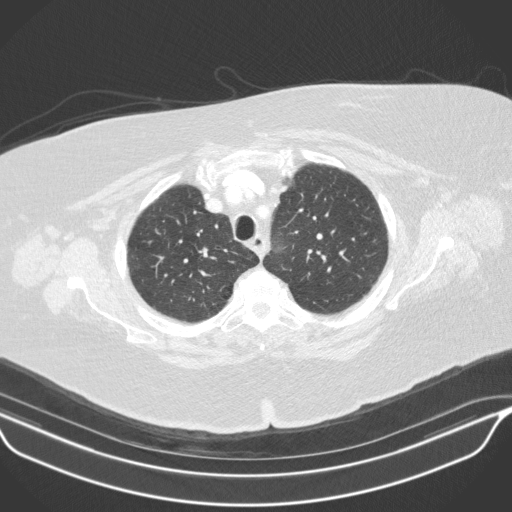
[im 143/168  lung]
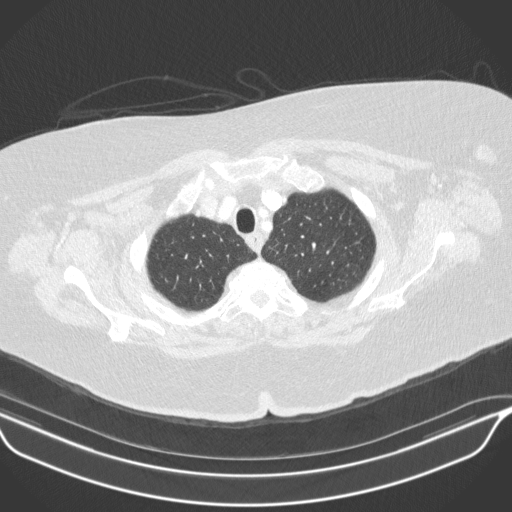
[im 155/168  lung]
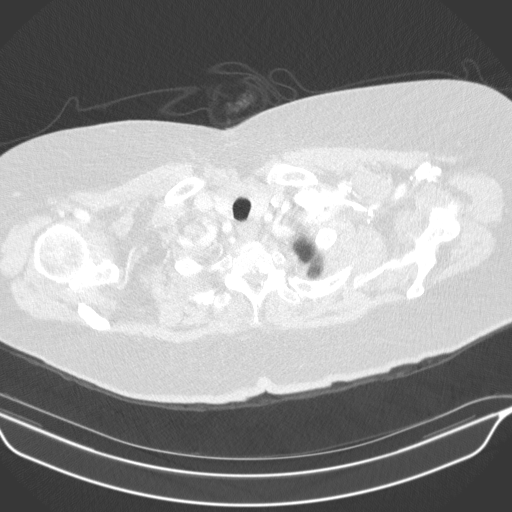

[15 of 34 positions shown; findings below may reference images not displayed]

FINDINGS: Cardiovascular: Mild aortic calcifications are noted without
aneurysmal dilatation or dissection. Coronary calcifications are
seen as well. Pulmonary artery demonstrates no definitive filling
defects. Mild cardiomegaly is seen.

Mediastinum/Nodes: The thoracic inlet is within normal limits. No
significant hilar or mediastinal adenopathy is noted.

Lungs/Pleura: Lungs are well aerated bilaterally. Minimal lingular
atelectasis is seen. No sizable effusion is noted. No parenchymal
nodule or focal infiltrate is seen.

Upper Abdomen: Within normal limits.

Musculoskeletal: Degenerative changes of the thoracic spine are
seen. No acute bony abnormality is noted.
IMPRESSION: The abnormality seen on recent chest x-ray corresponds to pulmonary
vascularity and likely some atelectasis. No focal abnormality is
noted on today's examination

Mild lingular atelectasis.

No acute abnormality noted.

## 2019-10-15 ENCOUNTER — Encounter (INDEPENDENT_AMBULATORY_CARE_PROVIDER_SITE_OTHER): Payer: Medicare Other | Admitting: Ophthalmology

## 2019-10-15 DIAGNOSIS — H35033 Hypertensive retinopathy, bilateral: Secondary | ICD-10-CM | POA: Diagnosis not present

## 2019-10-15 DIAGNOSIS — H353211 Exudative age-related macular degeneration, right eye, with active choroidal neovascularization: Secondary | ICD-10-CM | POA: Diagnosis not present

## 2019-10-15 DIAGNOSIS — H353122 Nonexudative age-related macular degeneration, left eye, intermediate dry stage: Secondary | ICD-10-CM | POA: Diagnosis not present

## 2019-10-15 DIAGNOSIS — H43813 Vitreous degeneration, bilateral: Secondary | ICD-10-CM

## 2019-10-15 DIAGNOSIS — I10 Essential (primary) hypertension: Secondary | ICD-10-CM

## 2019-11-12 ENCOUNTER — Other Ambulatory Visit: Payer: Self-pay

## 2019-11-12 ENCOUNTER — Encounter (INDEPENDENT_AMBULATORY_CARE_PROVIDER_SITE_OTHER): Payer: Medicare Other | Admitting: Ophthalmology

## 2019-11-12 DIAGNOSIS — H2513 Age-related nuclear cataract, bilateral: Secondary | ICD-10-CM

## 2019-11-12 DIAGNOSIS — H43813 Vitreous degeneration, bilateral: Secondary | ICD-10-CM

## 2019-11-12 DIAGNOSIS — I10 Essential (primary) hypertension: Secondary | ICD-10-CM | POA: Diagnosis not present

## 2019-11-12 DIAGNOSIS — H353211 Exudative age-related macular degeneration, right eye, with active choroidal neovascularization: Secondary | ICD-10-CM | POA: Diagnosis not present

## 2019-11-12 DIAGNOSIS — H353122 Nonexudative age-related macular degeneration, left eye, intermediate dry stage: Secondary | ICD-10-CM | POA: Diagnosis not present

## 2019-11-12 DIAGNOSIS — H35033 Hypertensive retinopathy, bilateral: Secondary | ICD-10-CM

## 2019-12-10 ENCOUNTER — Encounter (INDEPENDENT_AMBULATORY_CARE_PROVIDER_SITE_OTHER): Payer: Medicare Other | Admitting: Ophthalmology

## 2019-12-10 DIAGNOSIS — H353211 Exudative age-related macular degeneration, right eye, with active choroidal neovascularization: Secondary | ICD-10-CM

## 2019-12-10 DIAGNOSIS — H35033 Hypertensive retinopathy, bilateral: Secondary | ICD-10-CM

## 2019-12-10 DIAGNOSIS — H353122 Nonexudative age-related macular degeneration, left eye, intermediate dry stage: Secondary | ICD-10-CM | POA: Diagnosis not present

## 2019-12-10 DIAGNOSIS — H43813 Vitreous degeneration, bilateral: Secondary | ICD-10-CM

## 2019-12-10 DIAGNOSIS — H2513 Age-related nuclear cataract, bilateral: Secondary | ICD-10-CM

## 2019-12-10 DIAGNOSIS — I10 Essential (primary) hypertension: Secondary | ICD-10-CM

## 2020-01-07 ENCOUNTER — Encounter (INDEPENDENT_AMBULATORY_CARE_PROVIDER_SITE_OTHER): Payer: Medicare Other | Admitting: Ophthalmology

## 2020-01-08 ENCOUNTER — Encounter (INDEPENDENT_AMBULATORY_CARE_PROVIDER_SITE_OTHER): Payer: Medicare Other | Admitting: Ophthalmology

## 2020-01-08 DIAGNOSIS — H35033 Hypertensive retinopathy, bilateral: Secondary | ICD-10-CM | POA: Diagnosis not present

## 2020-01-08 DIAGNOSIS — H43813 Vitreous degeneration, bilateral: Secondary | ICD-10-CM | POA: Diagnosis not present

## 2020-01-08 DIAGNOSIS — I10 Essential (primary) hypertension: Secondary | ICD-10-CM

## 2020-01-08 DIAGNOSIS — H2513 Age-related nuclear cataract, bilateral: Secondary | ICD-10-CM

## 2020-01-08 DIAGNOSIS — H353231 Exudative age-related macular degeneration, bilateral, with active choroidal neovascularization: Secondary | ICD-10-CM | POA: Diagnosis not present

## 2020-01-30 ENCOUNTER — Ambulatory Visit: Payer: Medicare Other | Attending: Internal Medicine

## 2020-01-30 DIAGNOSIS — Z23 Encounter for immunization: Secondary | ICD-10-CM

## 2020-01-30 NOTE — Progress Notes (Signed)
   Covid-19 Vaccination Clinic  Name:  AMBRE KOBAYASHI    MRN: [id number] DOB: 06-27-1941  01/30/2020  Ms. Stringfield was observed post Covid-19 immunization for 15 minutes without incident. She was provided with Vaccine Information Sheet and instruction to access the V-Safe system.   Ms. Gotschall was instructed to call 911 with any severe reactions post vaccine: Marland Kitchen Difficulty breathing  . Swelling of face and throat  . A fast heartbeat  . A bad rash all over body  . Dizziness and weakness   Immunizations Administered    Name Date Dose VIS Date Route   Pfizer COVID-19 Vaccine 01/30/2020 12:11 PM 0.3 mL 11/06/2019 Intramuscular   Manufacturer: [employer], [employer]   Lot: [id number]   NDC: [address]-4

## 2020-02-12 ENCOUNTER — Encounter (INDEPENDENT_AMBULATORY_CARE_PROVIDER_SITE_OTHER): Payer: Medicare Other | Admitting: Ophthalmology

## 2020-02-12 DIAGNOSIS — H353211 Exudative age-related macular degeneration, right eye, with active choroidal neovascularization: Secondary | ICD-10-CM | POA: Diagnosis not present

## 2020-02-12 DIAGNOSIS — H353122 Nonexudative age-related macular degeneration, left eye, intermediate dry stage: Secondary | ICD-10-CM | POA: Diagnosis not present

## 2020-02-12 DIAGNOSIS — I10 Essential (primary) hypertension: Secondary | ICD-10-CM | POA: Diagnosis not present

## 2020-02-12 DIAGNOSIS — H35033 Hypertensive retinopathy, bilateral: Secondary | ICD-10-CM

## 2020-02-12 DIAGNOSIS — H43813 Vitreous degeneration, bilateral: Secondary | ICD-10-CM

## 2020-02-12 DIAGNOSIS — H2513 Age-related nuclear cataract, bilateral: Secondary | ICD-10-CM

## 2020-03-01 ENCOUNTER — Ambulatory Visit: Payer: Medicare Other | Attending: Internal Medicine

## 2020-03-01 DIAGNOSIS — Z23 Encounter for immunization: Secondary | ICD-10-CM

## 2020-03-01 NOTE — Progress Notes (Signed)
2  Covid-19 Vaccination Clinic  Name:  ADAM DEMARY    MRN: [id number] DOB: [birth year]  03/01/2020  Ms. Makepeace was observed post Covid-19 immunization for 15 minutes without incident. She was provided with Vaccine Information Sheet and instruction to access the V-Safe system.   Ms. Arrick was instructed to call 911 with any severe reactions post vaccine: Marland Kitchen Difficulty breathing  . Swelling of face and throat  . A fast heartbeat  . A bad rash all over body  . Dizziness and weakness   Immunizations Administered    Name Date Dose VIS Date Route   Pfizer COVID-19 Vaccine 03/01/2020  8:38 AM 0.3 mL 11/06/2019 Intramuscular   Manufacturer: [employer], [employer]   Lot: [id number]   NDC: [address]-4

## 2020-03-18 ENCOUNTER — Encounter (INDEPENDENT_AMBULATORY_CARE_PROVIDER_SITE_OTHER): Payer: Medicare Other | Admitting: Ophthalmology

## 2020-03-21 ENCOUNTER — Encounter (INDEPENDENT_AMBULATORY_CARE_PROVIDER_SITE_OTHER): Payer: Medicare Other | Admitting: Ophthalmology

## 2020-03-31 ENCOUNTER — Encounter (INDEPENDENT_AMBULATORY_CARE_PROVIDER_SITE_OTHER): Payer: Medicare Other | Admitting: Ophthalmology

## 2020-03-31 DIAGNOSIS — H353122 Nonexudative age-related macular degeneration, left eye, intermediate dry stage: Secondary | ICD-10-CM | POA: Diagnosis not present

## 2020-03-31 DIAGNOSIS — H35033 Hypertensive retinopathy, bilateral: Secondary | ICD-10-CM

## 2020-03-31 DIAGNOSIS — I10 Essential (primary) hypertension: Secondary | ICD-10-CM

## 2020-03-31 DIAGNOSIS — H353211 Exudative age-related macular degeneration, right eye, with active choroidal neovascularization: Secondary | ICD-10-CM

## 2020-03-31 DIAGNOSIS — H43813 Vitreous degeneration, bilateral: Secondary | ICD-10-CM

## 2020-05-05 ENCOUNTER — Encounter (INDEPENDENT_AMBULATORY_CARE_PROVIDER_SITE_OTHER): Payer: Medicare Other | Admitting: Ophthalmology

## 2020-05-16 ENCOUNTER — Other Ambulatory Visit: Payer: Self-pay

## 2020-05-16 ENCOUNTER — Encounter (INDEPENDENT_AMBULATORY_CARE_PROVIDER_SITE_OTHER): Payer: Medicare Other | Admitting: Ophthalmology

## 2020-05-16 DIAGNOSIS — H353122 Nonexudative age-related macular degeneration, left eye, intermediate dry stage: Secondary | ICD-10-CM | POA: Diagnosis not present

## 2020-05-16 DIAGNOSIS — I10 Essential (primary) hypertension: Secondary | ICD-10-CM | POA: Diagnosis not present

## 2020-05-16 DIAGNOSIS — H353211 Exudative age-related macular degeneration, right eye, with active choroidal neovascularization: Secondary | ICD-10-CM

## 2020-05-16 DIAGNOSIS — H35033 Hypertensive retinopathy, bilateral: Secondary | ICD-10-CM | POA: Diagnosis not present

## 2020-05-16 DIAGNOSIS — H43813 Vitreous degeneration, bilateral: Secondary | ICD-10-CM

## 2020-06-16 ENCOUNTER — Encounter (INDEPENDENT_AMBULATORY_CARE_PROVIDER_SITE_OTHER): Payer: Medicare Other | Admitting: Ophthalmology

## 2021-10-26 ENCOUNTER — Ambulatory Visit
Admission: RE | Admit: 2021-10-26 | Discharge: 2021-10-26 | Disposition: A | Discharge status: Home / Self Care | Payer: Medicare Other | Source: Ambulatory Visit | Attending: Family Medicine | Admitting: Family Medicine

## 2021-10-26 ENCOUNTER — Other Ambulatory Visit: Payer: Self-pay | Admitting: Family Medicine

## 2021-10-26 DIAGNOSIS — R52 Pain, unspecified: Secondary | ICD-10-CM

## 2022-03-01 ENCOUNTER — Emergency Department (HOSPITAL_COMMUNITY): Payer: Medicare Other

## 2022-03-01 ENCOUNTER — Other Ambulatory Visit: Payer: Self-pay

## 2022-03-01 ENCOUNTER — Emergency Department (HOSPITAL_COMMUNITY)
Admission: EM | Admit: 2022-03-01 | Discharge: 2022-03-01 | Disposition: A | Discharge status: Home / Self Care | Payer: Medicare Other | Attending: Emergency Medicine | Admitting: Emergency Medicine

## 2022-03-01 DIAGNOSIS — S8001XA Contusion of right knee, initial encounter: Secondary | ICD-10-CM | POA: Diagnosis not present

## 2022-03-01 DIAGNOSIS — S7001XA Contusion of right hip, initial encounter: Secondary | ICD-10-CM | POA: Insufficient documentation

## 2022-03-01 DIAGNOSIS — Z79899 Other long term (current) drug therapy: Secondary | ICD-10-CM | POA: Insufficient documentation

## 2022-03-01 DIAGNOSIS — I10 Essential (primary) hypertension: Secondary | ICD-10-CM | POA: Insufficient documentation

## 2022-03-01 DIAGNOSIS — Z7982 Long term (current) use of aspirin: Secondary | ICD-10-CM | POA: Diagnosis not present

## 2022-03-01 DIAGNOSIS — S0083XA Contusion of other part of head, initial encounter: Secondary | ICD-10-CM | POA: Insufficient documentation

## 2022-03-01 DIAGNOSIS — W01198A Fall on same level from slipping, tripping and stumbling with subsequent striking against other object, initial encounter: Secondary | ICD-10-CM | POA: Insufficient documentation

## 2022-03-01 DIAGNOSIS — Y9301 Activity, walking, marching and hiking: Secondary | ICD-10-CM | POA: Diagnosis not present

## 2022-03-01 DIAGNOSIS — S0990XA Unspecified injury of head, initial encounter: Secondary | ICD-10-CM | POA: Diagnosis present

## 2022-03-01 MED ORDER — METHOCARBAMOL 500 MG PO TABS
500.0000 mg | ORAL_TABLET | Freq: Once | ORAL | Status: AC
  Administered 2022-03-01: 500 mg via ORAL
  Filled 2022-03-01: qty 1

## 2022-03-01 MED ORDER — METHOCARBAMOL 500 MG PO TABS: 500.0000 mg | ORAL_TABLET | Freq: Two times a day (BID) | ORAL | 0 refills | Status: DC

## 2022-03-01 MED ORDER — ACETAMINOPHEN 500 MG PO TABS
1000.0000 mg | ORAL_TABLET | Freq: Once | ORAL | Status: AC
Start: 2022-03-01 — End: 2022-03-01
  Administered 2022-03-01: 1000 mg via ORAL
  Filled 2022-03-01: qty 2

## 2022-03-01 MED ORDER — OXYCODONE-ACETAMINOPHEN 5-325 MG PO TABS: 1.0000 | ORAL_TABLET | Freq: Four times a day (QID) | ORAL | 0 refills | Status: DC | PRN

## 2022-03-01 NOTE — ED Triage Notes (Signed)
Pt. Stated, I fell this morning going to bathroom and hit my head and I hurt my back again. ?

## 2022-03-01 NOTE — ED Provider Notes (Signed)
?[facility] ?Provider Note ? ? ?CSN: [id number] ?Arrival date & time: 03/01/22  8295 ? ?  ? ?History ? ?Chief Complaint  ?Patient presents with  ? Back Pain  ? Fall  ? Head Injury  ? ? ?[facility] is a 81 y.o. female. ? ?Patient is a 81 year old female with a history of hyperlipidemia, prediabetes, hypertension who is presenting today with ongoing pain in her left hip/back after a fall.  She reports she was walking and the door was cracked open and she did not notice it she hit her face on the door over the left eye and fell to the floor mostly on her right hip.  She denies any loss of consciousness, she was able to stand and walk initially and felt okay but after sitting she tried to get up again and had severe pain in her right hip and back area.  She took an Advil but reported minimal relief.  She reports she is feeling very uncomfortable and having a lot of pain.  She has had some mild pain in her right knee as well but denies any headache or neck pain. ? ?The history is provided by the patient and medical records.  ?Back Pain ?Fall ? ?Head Injury ? ?  ? ?Home Medications ?Prior to Admission medications   ?Medication Sig Start Date End Date Taking? Authorizing Provider  ?methocarbamol (ROBAXIN) 500 MG tablet Take 1 tablet (500 mg total) by mouth 2 (two) times daily. 03/01/22  Yes Gwyneth Sprout, MD  ?oxyCODONE-acetaminophen (PERCOCET/ROXICET) 5-325 MG tablet Take 1 tablet by mouth every 6 (six) hours as needed for severe pain. 03/01/22  Yes Nolawi Kanady, Alphonzo Lemmings, MD  ?amLODipine (NORVASC) 5 MG tablet Take 2.5-10 mg by mouth daily. 06/25/17   [provider]  ?Graylon Gunning Apply 1 application topically as needed (Arthritis Pain in fingers).    [provider]  ?aspirin EC 325 MG tablet Take 1 tablet (325 mg total) by mouth daily. ?Patient not taking: Reported on 06/29/2017 08/03/14   Sheral Apley, MD  ?celecoxib (CELEBREX) 200 MG capsule Take 1 capsule (200 mg  total) by mouth 2 (two) times daily. ?Patient not taking: Reported on 06/29/2017 08/03/14   Sheral Apley, MD  ?cromolyn (NASALCROM) 5.2 MG/ACT nasal spray Place 1 spray into both nostrils daily as needed for allergies.     [provider]  ?docusate sodium (COLACE) 100 MG capsule Take 1 capsule (100 mg total) by mouth 2 (two) times daily. Continue this while taking narcotics to help with bowel movements ?Patient not taking: Reported on 06/29/2017 08/03/14   Sheral Apley, MD  ?Lactobacillus (ACIDOPHILUS PO) Take 1 tablet by mouth daily.    [provider]  ?lisinopril (PRINIVIL,ZESTRIL) 20 MG tablet Take 10-40 mg by mouth daily.    [provider]  ?loratadine (CLARITIN) 10 MG tablet Take 10 mg by mouth daily.    [provider]  ?losartan (COZAAR) 100 MG tablet Take 50 mg by mouth daily.    [provider]  ?NAPROXEN PO Take 1-2 tablets by mouth as needed (Pain).    [provider]  ?   ? ?Allergies    ?Aspirin, Codeine, and Gelatin   ? ?Review of Systems   ?Review of Systems  ?Musculoskeletal:  Positive for back pain.  ? ?Physical Exam ?Updated Vital Signs ?BP (!) 150/65   Pulse 71   Temp 97.6 ?F (36.4 ?C) (Oral)   Resp 18   Ht  5\' 1"  (1.549 m)   Wt 108.9 kg   SpO2 98%   BMI 45.35 kg/m?  ?Physical Exam ?Vitals and nursing note reviewed.  ?Constitutional:   ?   General: She is not in acute distress. ?   Appearance: She is well-developed.  ?HENT:  ?   Head: Normocephalic.  ? ?   Mouth/Throat:  ?   Mouth: Mucous membranes are moist.  ?Eyes:  ?   Extraocular Movements: Extraocular movements intact.  ?   Pupils: Pupils are equal, round, and reactive to light.  ?Cardiovascular:  ?   Rate and Rhythm: Normal rate and regular rhythm.  ?   Heart sounds: Normal heart sounds. No murmur heard. ?  No friction rub.  ?Pulmonary:  ?   Effort: Pulmonary effort is normal.  ?   Breath sounds: Normal breath sounds. No wheezing or rales.  ?Abdominal:  ?   General: Bowel  sounds are normal. There is no distension.  ?   Palpations: Abdomen is soft.  ?   Tenderness: There is no abdominal tenderness. There is no guarding or rebound.  ?Musculoskeletal:     ?   General: Normal range of motion.  ?   Cervical back: Normal range of motion and neck supple. No tenderness.  ?   Right hip: Tenderness present. Normal range of motion.  ?   Right knee: Ecchymosis present. No swelling. Normal range of motion. No tenderness.  ?     Legs: ? ?   Comments: No edema  ?Skin: ?   General: Skin is warm and dry.  ?   Findings: No rash.  ?Neurological:  ?   Mental Status: She is alert and oriented to person, place, and time. Mental status is at baseline.  ?   Cranial Nerves: No cranial nerve deficit.  ?Psychiatric:     ?   Mood and Affect: Mood normal.     ?   Behavior: Behavior normal.  ? ? ?ED Results / Procedures / Treatments   ?Labs ?(all labs ordered are listed, but only abnormal results are displayed) ?Labs Reviewed - No data to display ? ?EKG ?None ? ?Radiology ?CT Head Wo Contrast ? ?Result Date: 03/01/2022 ?CLINICAL DATA:  Head trauma, minor (Age >= 65y); Facial trauma, blunt EXAM: CT HEAD WITHOUT CONTRAST CT MAXILLOFACIAL WITHOUT CONTRAST TECHNIQUE: Multidetector CT imaging of the head and maxillofacial structures were performed using the standard protocol without intravenous contrast. Multiplanar CT image reconstructions of the maxillofacial structures were also generated. RADIATION DOSE REDUCTION: This exam was performed according to the departmental dose-optimization program which includes automated exposure control, adjustment of the mA and/or kV according to patient size and/or use of iterative reconstruction technique. COMPARISON:  Head CT 11/28/2011 FINDINGS: CT HEAD FINDINGS Brain: No evidence of acute intracranial hemorrhage or extra-axial collection.No evidence of mass lesion/concern mass effect.The ventricles are normal in size.Confluent periventricular and subcortical white matter  hypoattenuation, which is nonspecific but likely sequela of chronic small vessel ischemic disease. Vascular: No hyperdense vessel. Skull: Negative for skull fracture. Hyperostosis frontalis internus. Other: None. CT MAXILLOFACIAL FINDINGS Osseous: Chronic right nasal bone deformity.  No acute fracture. Orbits: Negative. No traumatic or inflammatory finding. Sinuses: Minimal paranasal sinus mucosal thickening. Soft tissues: There is left periorbital soft tissue swelling. IMPRESSION: No acute intracranial abnormality. Chronic small vessel ischemic disease. No acute facial fracture.  Chronic right nasal bone deformity. Left periorbital soft tissue swelling. Electronically Signed   By: 01/26/2012 M.D.   On: 03/01/2022 10:14  ? ?  CT Hip Right Wo Contrast ? ?Result Date: 03/01/2022 ?CLINICAL DATA:  Hip trauma, fracture suspected, xray done EXAM: CT OF THE RIGHT HIP WITHOUT CONTRAST TECHNIQUE: Multidetector CT imaging of the right hip was performed according to the standard protocol. Multiplanar CT image reconstructions were also generated. RADIATION DOSE REDUCTION: This exam was performed according to the departmental dose-optimization program which includes automated exposure control, adjustment of the mA and/or kV according to patient size and/or use of iterative reconstruction technique. COMPARISON:  Same day hip radiograph FINDINGS: Bones/Joint/Cartilage There is no evidence of acute right hip fracture. There is mild to moderate right hip osteoarthritis. Enthesophyte formation on the greater trochanter. Ligaments Suboptimally assessed by CT. Muscles and Tendons There is right gluteus minimus and medius muscle atrophy. Soft tissues No focal fluid collection.  Sigmoid diverticulosis. IMPRESSION: No evidence of acute right hip fracture. 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 Electronically Signed   By: Caprice RenshawJacob  Kahn M.D.   On: 03/01/2022 11:11  ? ?DG Hip Unilat W or Wo Pelvis 2-3 Views Right ? ?Result Date:  03/01/2022 ?CLINICAL DATA:  Pain after fall EXAM: DG HIP (WITH OR WITHOUT PELVIS) comment 3V RIGHT COMPARISON:  Hip radiograph dated October 26, 2021 FINDINGS: There is no evidence of hip fracture or dislocation. Degenerative changes o

## 2022-03-01 NOTE — Discharge Instructions (Signed)
All the x-rays look okay today with no sign of anything being broken.  You are just badly bruised.  Continue to use Tylenol, Motrin but you are given a stronger pain medication if you need it and a muscle relaxer.  Make sure you are taking a laxative to confirm you do not get constipated. ?

## 2023-06-15 ENCOUNTER — Other Ambulatory Visit: Payer: Self-pay

## 2023-06-15 ENCOUNTER — Encounter (HOSPITAL_COMMUNITY): Payer: Self-pay | Admitting: Emergency Medicine

## 2023-06-15 ENCOUNTER — Emergency Department (HOSPITAL_COMMUNITY): Payer: 59

## 2023-06-15 ENCOUNTER — Inpatient Hospital Stay (HOSPITAL_COMMUNITY)
Admission: EM | Admit: 2023-06-15 | Discharge: 2023-06-20 | DRG: 291 | Disposition: A | Discharge status: Home / Self Care | Payer: 59 | Attending: Internal Medicine | Admitting: Internal Medicine

## 2023-06-15 DIAGNOSIS — I509 Heart failure, unspecified: Secondary | ICD-10-CM

## 2023-06-15 DIAGNOSIS — Z91018 Allergy to other foods: Secondary | ICD-10-CM

## 2023-06-15 DIAGNOSIS — I420 Dilated cardiomyopathy: Secondary | ICD-10-CM

## 2023-06-15 DIAGNOSIS — I4891 Unspecified atrial fibrillation: Principal | ICD-10-CM | POA: Diagnosis present

## 2023-06-15 DIAGNOSIS — F17210 Nicotine dependence, cigarettes, uncomplicated: Secondary | ICD-10-CM | POA: Diagnosis present

## 2023-06-15 DIAGNOSIS — M159 Polyosteoarthritis, unspecified: Secondary | ICD-10-CM | POA: Diagnosis present

## 2023-06-15 DIAGNOSIS — Z8249 Family history of ischemic heart disease and other diseases of the circulatory system: Secondary | ICD-10-CM

## 2023-06-15 DIAGNOSIS — E876 Hypokalemia: Secondary | ICD-10-CM | POA: Diagnosis not present

## 2023-06-15 DIAGNOSIS — Z79899 Other long term (current) drug therapy: Secondary | ICD-10-CM

## 2023-06-15 DIAGNOSIS — Z6841 Body Mass Index (BMI) 40.0 and over, adult: Secondary | ICD-10-CM

## 2023-06-15 DIAGNOSIS — I480 Paroxysmal atrial fibrillation: Secondary | ICD-10-CM | POA: Diagnosis present

## 2023-06-15 DIAGNOSIS — I110 Hypertensive heart disease with heart failure: Secondary | ICD-10-CM | POA: Diagnosis not present

## 2023-06-15 DIAGNOSIS — Z7982 Long term (current) use of aspirin: Secondary | ICD-10-CM

## 2023-06-15 DIAGNOSIS — E785 Hyperlipidemia, unspecified: Secondary | ICD-10-CM | POA: Diagnosis present

## 2023-06-15 DIAGNOSIS — R7303 Prediabetes: Secondary | ICD-10-CM | POA: Diagnosis present

## 2023-06-15 DIAGNOSIS — Z1152 Encounter for screening for COVID-19: Secondary | ICD-10-CM

## 2023-06-15 DIAGNOSIS — I5021 Acute systolic (congestive) heart failure: Secondary | ICD-10-CM

## 2023-06-15 DIAGNOSIS — Z7901 Long term (current) use of anticoagulants: Secondary | ICD-10-CM

## 2023-06-15 DIAGNOSIS — Z66 Do not resuscitate: Secondary | ICD-10-CM | POA: Diagnosis present

## 2023-06-15 DIAGNOSIS — I4892 Unspecified atrial flutter: Secondary | ICD-10-CM | POA: Diagnosis present

## 2023-06-15 DIAGNOSIS — Z96651 Presence of right artificial knee joint: Secondary | ICD-10-CM | POA: Diagnosis present

## 2023-06-15 DIAGNOSIS — Z885 Allergy status to narcotic agent status: Secondary | ICD-10-CM

## 2023-06-15 DIAGNOSIS — I10 Essential (primary) hypertension: Secondary | ICD-10-CM | POA: Diagnosis present

## 2023-06-15 DIAGNOSIS — Z886 Allergy status to analgesic agent status: Secondary | ICD-10-CM

## 2023-06-15 LAB — CBC
HCT: 39.2 % (ref 36.0–46.0)
Hemoglobin: 12.5 g/dL (ref 12.0–15.0)
MCH: 27.7 pg (ref 26.0–34.0)
MCHC: 31.9 g/dL (ref 30.0–36.0)
MCV: 86.9 fL (ref 80.0–100.0)
Platelets: 264 10*3/uL (ref 150–400)
RBC: 4.51 MIL/uL (ref 3.87–5.11)
RDW: 13.6 % (ref 11.5–15.5)
WBC: 12.3 10*3/uL — ABNORMAL HIGH (ref 4.0–10.5)
nRBC: 0 % (ref 0.0–0.2)

## 2023-06-15 LAB — BASIC METABOLIC PANEL
Anion gap: 9 (ref 5–15)
BUN: 16 mg/dL (ref 8–23)
CO2: 25 mmol/L (ref 22–32)
Calcium: 9.5 mg/dL (ref 8.9–10.3)
Chloride: 104 mmol/L (ref 98–111)
Creatinine, Ser: 0.75 mg/dL (ref 0.44–1.00)
GFR, Estimated: >60 mL/min (ref >60)
Glucose, Bld: 101 mg/dL — ABNORMAL HIGH (ref 70–99)
Potassium: 3.7 mmol/L (ref 3.5–5.1)
Sodium: 138 mmol/L (ref 135–145)

## 2023-06-15 LAB — SARS CORONAVIRUS 2 BY RT PCR: SARS Coronavirus 2 by RT PCR: NEGATIVE

## 2023-06-15 LAB — TSH: TSH: 2.361 u[IU]/mL (ref 0.350–4.500)

## 2023-06-15 LAB — BRAIN NATRIURETIC PEPTIDE: B Natriuretic Peptide: 207.3 pg/mL — ABNORMAL HIGH (ref 0.0–100.0)

## 2023-06-15 LAB — MAGNESIUM: Magnesium: 2.1 mg/dL (ref 1.7–2.4)

## 2023-06-15 MED ORDER — HEPARIN BOLUS VIA INFUSION
4000.0000 [IU] | Freq: Once | INTRAVENOUS | Status: AC
  Administered 2023-06-15: 4000 [IU] via INTRAVENOUS
  Filled 2023-06-15: qty 4000

## 2023-06-15 MED ORDER — DILTIAZEM LOAD VIA INFUSION
10.0000 mg | Freq: Once | INTRAVENOUS | Status: AC
  Administered 2023-06-15: 10 mg via INTRAVENOUS
  Filled 2023-06-15: qty 10

## 2023-06-15 MED ORDER — ONDANSETRON HCL 4 MG/2ML IJ SOLN: 4.0000 mg | Freq: Four times a day (QID) | INTRAMUSCULAR | Status: DC | PRN

## 2023-06-15 MED ORDER — HEPARIN (PORCINE) 25000 UT/250ML-% IV SOLN
1700.0000 [IU]/h | INTRAVENOUS | Status: DC
  Administered 2023-06-15: 1200 [IU]/h via INTRAVENOUS
  Administered 2023-06-16: 1450 [IU]/h via INTRAVENOUS
  Administered 2023-06-17: 1700 [IU]/h via INTRAVENOUS
  Filled 2023-06-15 (×3): qty 250

## 2023-06-15 MED ORDER — FUROSEMIDE 10 MG/ML IJ SOLN
40.0000 mg | Freq: Once | INTRAMUSCULAR | Status: AC
  Administered 2023-06-15: 40 mg via INTRAVENOUS
  Filled 2023-06-15: qty 4

## 2023-06-15 MED ORDER — DILTIAZEM HCL-DEXTROSE 125-5 MG/125ML-% IV SOLN (PREMIX)
5.0000 mg/h | INTRAVENOUS | Status: DC
  Administered 2023-06-15: 5 mg/h via INTRAVENOUS
  Administered 2023-06-16: 7.5 mg/h via INTRAVENOUS
  Administered 2023-06-16: 15 mg/h via INTRAVENOUS
  Filled 2023-06-15 (×3): qty 125

## 2023-06-15 MED ORDER — DIPHENHYDRAMINE HCL 25 MG PO CAPS
25.0000 mg | ORAL_CAPSULE | Freq: Every evening | ORAL | Status: DC | PRN
  Administered 2023-06-16: 25 mg via ORAL
  Filled 2023-06-15: qty 1

## 2023-06-15 MED ORDER — ACETAMINOPHEN 325 MG PO TABS: 650.0000 mg | ORAL_TABLET | ORAL | Status: DC | PRN

## 2023-06-15 NOTE — Assessment & Plan Note (Addendum)
SOB for past few days, pulm vasc congestion, peripheral edema, and elevated BNP. 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

## 2023-06-15 NOTE — Progress Notes (Signed)
ANTICOAGULATION CONSULT NOTE - Initial Consult  Pharmacy Consult for Heparin Indication: atrial fibrillation  Allergies  Allergen Reactions   Aspirin Nausea Only   Codeine Nausea And Vomiting   Gelatin     gellcapsuls cause reaction    Patient Measurements:   Heparin Dosing Weight: 75 kg  Vital Signs: Temp: 97.7 F (36.5 C) (07/20 1552) Temp Source: Oral (07/20 1552) BP: 129/93 (07/20 1730) Pulse Rate: 123 (07/20 1730)  Labs: Recent Labs    06/15/23 1832  HGB 12.5  HCT 39.2  PLT 264  CREATININE 0.75    CrCl cannot be calculated (Unknown ideal weight.).   Medical History: Past Medical History:  Diagnosis Date   Anxiety    Arthritis    "qwhere"   Chronic bronchitis (HCC)    "quit after I stopped smoking"   Depression    Exertional shortness of breath    Hyperlipidemia    Hypertension    Obesity    Prediabetes    Shingles     Medications:  (Not in a hospital admission)  Scheduled:   furosemide  40 mg Intravenous Once   Infusions:   diltiazem (CARDIZEM) infusion 5 mg/hr (06/15/23 1828)   PRN:   Assessment: 81 yof with a history of HTN. Patient is presenting with dyspnea. Heparin per pharmacy consult placed for atrial fibrillation.  Patient is not on anticoagulation prior to arrival.  Hgb 12.5; plt 264  Goal of Therapy:  Heparin level 0.3-0.7 units/ml Monitor platelets by anticoagulation protocol: Yes   Plan:  Give IV heparin 4000 units bolus x 1 Start heparin infusion at 1200 units/hr Check anti-Xa level in 8 hours and daily while on heparin Continue to monitor H&H and platelets  Delmar Landau, PharmD, BCPS 06/15/2023 7:26 PM ED Clinical Pharmacist -  [phone]

## 2023-06-15 NOTE — Assessment & Plan Note (Addendum)
New onset. Rate control of A.Fib with cardizem gtt Unclear duration of A.Fib (thought to be at least "a few days" now) so presumably will need [facility] for some time before any consideration of cardioversion by cardiology. Heparin gtt started, likely will need ongoing AC for stroke prevention with either xarelto or eliquis as outpt long term as CHADS-VASC = 4 (+2 for age, +1 for gender, and +1 for h/o HTN) Referral to A.Fib clinic

## 2023-06-15 NOTE — Progress Notes (Signed)
MEWS Progress Note  Patient Details Name: Bailey Solomon MRN: [id number] DOB: 03-Mar-1941 Today's Date: 06/15/2023   MEWS Flowsheet Documentation:  Assess: MEWS Score Temp: 98.1 F (36.7 C) BP: (!) 140/87 MAP (mmHg): 103 Pulse Rate: (!) 123 ECG Heart Rate: (!) 131 Resp: 19 Level of Consciousness: Alert SpO2: 98 % O2 Device: Room Air Assess: MEWS Score MEWS Temp: 0 MEWS Systolic: 0 MEWS Pulse: 3 MEWS RR: 0 MEWS LOC: 0 MEWS Score: 3 MEWS Score Color: Yellow Assess: SIRS CRITERIA SIRS Temperature : 0 SIRS Respirations : 0 SIRS Pulse: 1 SIRS WBC: 1 SIRS Score Sum : 2 SIRS Temperature : 0 SIRS Pulse: 1 SIRS Respirations : 0 SIRS WBC: 1 SIRS Score Sum : 2 Assess: if the MEWS score is Yellow or Red Were vital signs taken at a resting state?: Yes Focused Assessment: No change from prior assessment Does the patient meet 2 or more of the SIRS criteria?: Yes Does the patient have a confirmed or suspected source of infection?: No MEWS guidelines implemented : Yes, yellow Treat MEWS Interventions: Considered administering scheduled or prn medications/treatments as ordered Take Vital Signs Increase Vital Sign Frequency : Yellow: Q2hr x1, continue Q4hrs until patient remains green for 12hrs Escalate MEWS: Escalate: Yellow: Discuss with charge nurse and consider notifying provider and/or RRT Notify: Charge Nurse/RN Name of Charge Nurse/RN Notified: Noreene Larsson, RN      [employer], BSN, PCCN  06/15/2023, 11:43 PM

## 2023-06-15 NOTE — H&P (Signed)
History and Physical    Patient: Bailey Solomon WUJ:[id number] DOB: Jul 28, 1941 DOA: 06/15/2023 DOS: the patient was seen and examined on 06/15/2023 PCP: Kaleen Mask, MD  Patient coming from: Home  Chief Complaint:  Chief Complaint  Patient presents with   Tachycardia   HPI: Bailey Solomon is a 82 y.o. female with medical history significant of HTN, HLD, prior smoking, obesity.  Pt in to ED with c/o palpitations, SOB for past onset and worsening for the past "few days".  Today checked BP with a home monitor which noted elevated HR and told her she was in A.Fib.  No PMH of A.Fib previously.  Pt in to ED.  In ED: Pt confirmed to be in A.Fib RVR with rates up to 138.  CXR demonstrates pulmonary vasc congestion.  Mild peripheral edema noted.  BNP 207  No PMH of CHF previously.  Pt given 10mg  cardizem bolus and started on cardizem gtt for rate control.  Rates now down to 110s.  EDP also gave pt 40mg  lasix x1.   Review of Systems: As mentioned in the history of present illness. All other systems reviewed and are negative. 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 Her smoking use included cigarettes. She has a 57 pack-year smoking history. She has never used smokeless tobacco. She reports current alcohol use. She reports that she does not use drugs.  Allergies   Allergen Reactions   Aspirin Nausea Only   Codeine Nausea And Vomiting   Gelatin     gellcapsuls cause reaction    Family History  Problem Relation Age of Onset   Asthma Other    Cancer Other    Hypertension Other    Heart attack Other     Prior to Admission medications   Medication Sig Start Date End Date Taking? Authorizing Provider  amLODipine (NORVASC) 5 MG tablet Take 2.5-10 mg by mouth daily. 06/25/17   [provider]  ARNICA EX Apply 1 application topically as needed (Arthritis Pain in fingers).    [provider]  aspirin EC 325 MG tablet Take 1 tablet (325 mg total) by mouth daily. Patient not taking: Reported on 06/29/2017 08/03/14   Sheral Apley, MD  celecoxib (CELEBREX) 200 MG capsule Take 1 capsule (200 mg total) by mouth 2 (two) times daily. Patient not taking: Reported on 06/29/2017 08/03/14   Sheral Apley, MD  cromolyn (NASALCROM) 5.2 MG/ACT nasal spray Place 1 spray into both nostrils daily as needed for allergies.     [provider]  docusate sodium (COLACE) 100 MG capsule Take 1 capsule (100 mg total) by mouth 2 (two) times daily. Continue this while taking narcotics to help with bowel movements Patient not taking: Reported on 06/29/2017 08/03/14  Sheral Apley, MD  Lactobacillus (ACIDOPHILUS PO) Take 1 tablet by mouth daily.    [provider]  lisinopril (PRINIVIL,ZESTRIL) 20 MG tablet Take 10-40 mg by mouth daily.    [provider]  loratadine (CLARITIN) 10 MG tablet Take 10 mg by mouth daily.    [provider]  losartan (COZAAR) 100 MG tablet Take 50 mg by mouth daily.    [provider]  methocarbamol (ROBAXIN) 500 MG tablet Take 1 tablet (500 mg total) by mouth 2 (two) times daily. 03/01/22   Gwyneth Sprout, MD  NAPROXEN PO Take 1-2 tablets by mouth as needed (Pain).    [provider]  oxyCODONE-acetaminophen (PERCOCET/ROXICET) 5-325 MG tablet Take 1 tablet by mouth every 6 (six)  hours as needed for severe pain. 03/01/22   Gwyneth Sprout, MD    Physical Exam: Vitals:   06/15/23 1552 06/15/23 1715 06/15/23 1730 06/15/23 1926  BP: 133/71 122/76 (!) 129/93 112/81  Pulse: (!) 138 (!) 135 (!) 123 (!) 119  Resp: (!) 22 11 (!) 21 17  Temp: 97.7 F (36.5 C)     TempSrc: Oral     SpO2: 98% 98% 98% 99%   Constitutional: NAD, calm, comfortable Respiratory: clear to auscultation bilaterally, no wheezing, no crackles. Normal respiratory effort. No accessory muscle use.  Cardiovascular: Tachycardic, IRR, IRR, BLE edema present Abdomen: no tenderness, no masses palpated. No hepatosplenomegaly. Bowel sounds positive.  Neurologic: CN 2-12 grossly intact. Sensation intact, DTR normal. Strength 5/5 in all 4.  Psychiatric: Normal judgment and insight. Alert and oriented x 3. Normal mood.   Data Reviewed:    Labs on Admission: I have personally reviewed following labs and imaging studies  CBC: Recent Labs  Lab 06/15/23 1832  WBC 12.3*  HGB 12.5  HCT 39.2  MCV 86.9  PLT 264   Basic Metabolic Panel: Recent Labs  Lab 06/15/23 1832  NA 138  K 3.7  CL 104  CO2 25  GLUCOSE 101*  BUN 16  CREATININE 0.75  CALCIUM 9.5  MG 2.1   GFR: CrCl cannot be calculated (Unknown ideal weight.). Liver Function Tests: No results for input(s): "AST", "ALT", "ALKPHOS", "BILITOT", "PROT", "ALBUMIN" in the last 168 hours. No results for input(s): "LIPASE", "AMYLASE" in the last 168 hours. No results for input(s): "AMMONIA" in the last 168 hours. Coagulation Profile: No results for input(s): "INR", "PROTIME" in the last 168 hours. Cardiac Enzymes: No results for input(s): "CKTOTAL", "CKMB", "CKMBINDEX", "TROPONINI" in the last 168 hours. BNP (last 3 results) No results for input(s): "PROBNP" in the last 8760 hours. HbA1C: No results for input(s): "HGBA1C" in the last 72 hours. CBG: No results for input(s): "GLUCAP" in the last 168 hours. Lipid Profile: No results for  input(s): "CHOL", "HDL", "LDLCALC", "TRIG", "CHOLHDL", "LDLDIRECT" in the last 72 hours. Thyroid Function Tests: Recent Labs    06/15/23 1832  TSH 2.361   Anemia Panel: No results for input(s): "VITAMINB12", "FOLATE", "FERRITIN", "TIBC", "IRON", "RETICCTPCT" in the last 72 hours. Urine analysis:    Component Value Date/Time   COLORURINE YELLOW 06/29/2017 1305   APPEARANCEUR CLEAR 06/29/2017 1305   LABSPEC 1.003 (L) 06/29/2017 1305   PHURINE 7.0 06/29/2017 1305   GLUCOSEU NEGATIVE 06/29/2017 1305   HGBUR NEGATIVE 06/29/2017 1305   BILIRUBINUR NEGATIVE 06/29/2017 1305   KETONESUR NEGATIVE 06/29/2017 1305   PROTEINUR NEGATIVE 06/29/2017 1305   UROBILINOGEN 1.0 07/26/2014 1131   NITRITE NEGATIVE 06/29/2017 1305   LEUKOCYTESUR NEGATIVE 06/29/2017 1305  Radiological Exams on Admission: DG Chest Port 1 View  Result Date: 06/15/2023 CLINICAL DATA:  Shortness of breath EXAM: PORTABLE CHEST 1 VIEW COMPARISON:  Chest x-ray 01/17/2017 FINDINGS: Heart is enlarged. There central pulmonary vascular congestion. Right costophrenic angle has been excluded. There is no large pleural effusion, focal lung infiltrate or pneumothorax. No acute fractures are seen. IMPRESSION: Cardiomegaly with central pulmonary vascular congestion. Electronically Signed   By: Darliss Cheney M.D.   On: 06/15/2023 17:28    EKG: Independently reviewed.   Assessment and Plan: * Atrial fibrillation with RVR (HCC) New onset. Rate control of A.Fib with cardizem gtt Unclear duration of A.Fib (thought to be at least "a few days" now) so presumably will need [facility] for some time before any consideration of cardioversion by cardiology. Heparin gtt started, likely will need ongoing AC for stroke prevention with either xarelto or eliquis as outpt long term as CHADS-VASC = 4 (+2 for age, +1 for gender, and +1 for h/o HTN) Referral to A.Fib clinic  New onset of congestive heart failure (HCC) SOB for past few days, pulm vasc  congestion, peripheral edema, and elevated BNP. Likely rate induced CHF from the A.Fib RVR Rate control A.Fib as above 2d echo Got Lasix in ED, will hold off on further diuresis for the moment till we see how she does with this + rate control  HTN (hypertension) Home med rec pending But right now BP 110s systolic on the cardizem drip. So would prefer to hold most home BP meds for the moment anyhow till we see how her BP does with rate control meds.      Advance Care Planning:   Code Status: DNR DNR status confirmed by Pt and daughter  Consults: None  Family Communication: Daughter at bedside  Severity of Illness: The appropriate patient status for this patient is OBSERVATION. Observation status is judged to be reasonable and necessary in order to provide the required intensity of service to ensure the patient's safety. The patient's presenting symptoms, physical exam findings, and initial radiographic and laboratory data in the context of their medical condition is felt to place them at decreased risk for further clinical deterioration. Furthermore, it is anticipated that the patient will be medically stable for discharge from the hospital within 2 midnights of admission.   Author: Hillary Bow., DO 06/15/2023 7:59 PM  For on call review [email or web address].

## 2023-06-15 NOTE — Assessment & Plan Note (Signed)
Home med rec pending But right now BP 110s systolic on the cardizem drip. So would prefer to hold most home BP meds for the moment anyhow till we see how her BP does with rate control meds.

## 2023-06-15 NOTE — ED Notes (Signed)
ED TO INPATIENT HANDOFF REPORT  ED Nurse Name and Phone #: Jaylan Duggar [phone]  S Name/Age/Gender Bailey Solomon 82 y.o. female Room/Bed: 020C/020C  Code Status   Code Status: Full Code  Home/SNF/Other Home Patient oriented to: self, place, time, and situation Is this baseline? Yes   Triage Complete: Triage complete  Chief Complaint Atrial fibrillation with RVR (HCC) [I48.91]  Triage Note Pt presents stating she has afib. No hx of same.  States one of her BP cuffs told her she was in afib.  Pt's daughter states she had her watch on her and her HR went from 57s to 150s.  Pt's initial EKG appeared to be afib but then the final interpretation appears to be SVT.  Will prioritize rooming.    Allergies Allergies  Allergen Reactions   Aspirin Other (See Comments)    "Burns stomach"   Codeine Nausea And Vomiting   Gelatin Other (See Comments)    Gel/capsules - unable to swallow them, "get stuck in throat"    Level of Care/Admitting Diagnosis ED Disposition     ED Disposition  Admit   Condition  --   Comment  Hospital Area: [facility] [[id number]]  Level of Care: Telemetry Cardiac [103]  May place patient in observation at [facility] or [facility] if equivalent level of care is available:: No  Covid Evaluation: Asymptomatic - no recent exposure (last 10 days) testing not required  Diagnosis: Atrial fibrillation with RVR [facility]) [[id number]]  Admitting Physician: Hillary Bow [phone]  Attending Physician: Hillary Bow [[id number]]          B Medical/Surgery History Past Medical History:  Diagnosis Date   Anxiety    Arthritis    "qwhere"   Chronic bronchitis (HCC)    "quit after I stopped smoking"   Depression    Exertional shortness of breath    Hyperlipidemia    Hypertension    Obesity    Prediabetes    Shingles    Past Surgical History:  Procedure Laterality Date   JOINT REPLACEMENT     TONSILLECTOMY     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   Procedure: RIGHT TOTAL KNEE ARTHROPLASTY;  Surgeon: Sheral Apley, MD;  Location: [facility] OR;  Service: Orthopedics;  Laterality: Right;   TUBAL LIGATION       A IV Location/Drains/Wounds Patient Lines/Drains/Airways Status     Active Line/Drains/Airways     Name Placement date Placement time Site Days   Peripheral IV 06/15/23 20 G Right Antecubital 06/15/23  1812  Antecubital  less than 1   Peripheral IV 06/15/23 22 G 1" Posterior;Right Wrist 06/15/23  1954  Wrist  less than 1   External Urinary Catheter 06/15/23  1738  --  less than 1   Incision (Closed) 08/03/14 Leg Right 08/03/14  1524  -- 3238            Intake/Output Last 24 hours No intake or output data in the 24 hours ending 06/15/23 2100  Labs/Imaging Results for orders placed or performed during the hospital encounter of 06/15/23 (from the past 48 hour(s))  SARS Coronavirus 2 by RT PCR (hospital order, performed in [facility] hospital lab) *cepheid single result test* Anterior Nasal Swab     Status: None   Collection Time: 06/15/23  4:47 PM   Specimen: Anterior Nasal Swab  Result Value Ref Range   SARS Coronavirus 2 by RT PCR NEGATIVE NEGATIVE  Comment: Performed at [facility], [address] [place], [place] [address]  Basic metabolic panel     Status: Abnormal   Collection Time: 06/15/23  6:32 PM  Result Value Ref Range   Sodium 138 135 - 145 mmol/L   Potassium 3.7 3.5 - 5.1 mmol/L   Chloride 104 98 - 111 mmol/L   CO2 25 22 - 32 mmol/L   Glucose, Bld 101 (H) 70 - 99 mg/dL    Comment: Glucose reference range applies only to samples taken after fasting for at least 8 hours.   BUN 16 8 - 23 mg/dL   Creatinine, Ser 1.91 0.44 - 1.00 mg/dL   Calcium 9.5 8.9 - 47.8 mg/dL   GFR, Estimated >29 >56 mL/min    Comment: (NOTE) Calculated using the CKD-EPI Creatinine Equation (2021)    Anion gap 9 5 - 15    Comment: Performed at [facility], [address], [place], [place] [address]  Magnesium     Status: None   Collection Time: 06/15/23  6:32 PM  Result Value Ref Range   Magnesium 2.1 1.7 - 2.4 mg/dL    Comment: Performed at [facility], [address] [place], [place] [address]  CBC     Status: Abnormal   Collection Time: 06/15/23  6:32 PM  Result Value Ref Range   WBC 12.3 (H) 4.0 - 10.5 K/uL   RBC 4.51 3.87 - 5.11 MIL/uL   Hemoglobin 12.5 12.0 - 15.0 g/dL   HCT 69.6 29.5 - 28.4 %   MCV 86.9 80.0 - 100.0 fL   MCH 27.7 26.0 - 34.0 pg   MCHC 31.9 30.0 - 36.0 g/dL   RDW 13.2 44.0 - 10.2 %   Platelets 264 150 - 400 K/uL   nRBC 0.0 0.0 - 0.2 %    Comment: Performed at [facility], [address], [place], [place] [address]  TSH     Status: None   Collection Time: 06/15/23  6:32 PM  Result Value Ref Range   TSH 2.361 0.350 - 4.500 uIU/mL    Comment: Performed by a 3rd Generation assay with a functional sensitivity of <=0.01 uIU/mL. Performed at [facility], [address] [place], [place] [address]   Brain natriuretic peptide (IF shortness of breath has been documented this visit)     Status: Abnormal   Collection Time: 06/15/23  6:32 PM  Result Value Ref Range   B Natriuretic Peptide 207.3 (H) 0.0 - 100.0 pg/mL    Comment: Performed at [facility], [address] [place], [place] [address]   DG Chest Port 1 View  Result Date: 06/15/2023 CLINICAL DATA:  Shortness of breath EXAM: PORTABLE CHEST 1 VIEW COMPARISON:  Chest x-ray 01/17/2017 FINDINGS: Heart is enlarged. There central pulmonary vascular congestion. Right costophrenic angle has been excluded. There is no large pleural effusion, focal lung infiltrate or pneumothorax. No acute fractures are seen. IMPRESSION: Cardiomegaly with central pulmonary vascular congestion. Electronically Signed   By: Darliss Cheney M.D.   On: 06/15/2023 17:28    Pending Labs Unresulted Labs (From admission, onward)     Start     Ordered   06/17/23 0500  Heparin level  (unfractionated)  Daily,   R      06/15/23 1928   06/16/23 0500  CBC  Tomorrow morning,   R        06/15/23 1951   06/16/23 0500  Basic metabolic panel  Tomorrow morning,   R        06/15/23 1951   06/16/23 0400  Heparin level (unfractionated)  Once-Timed,   URGENT        06/15/23 1928            Vitals/Pain Today's Vitals   06/15/23 1715 06/15/23 1730 06/15/23 1926 06/15/23 1928  BP: 122/76 (!) 129/93 112/81   Pulse: (!) 135 (!) 123 (!) 119   Resp: 11 (!) 21 17   Temp:      TempSrc:      SpO2: 98% 98% 99%   PainSc:    0-No pain    Isolation Precautions No active isolations  Medications Medications  diltiazem (CARDIZEM) 1 mg/mL load via infusion 10 mg (10 mg Intravenous Bolus from Bag 06/15/23 1828)    And  diltiazem (CARDIZEM) 125 mg in dextrose 5% 125 mL (1 mg/mL) infusion (5 mg/hr Intravenous New Bag/Given 06/15/23 1828)  heparin ADULT infusion 100 units/mL (25000 units/298mL) (1,200 Units/hr Intravenous New Bag/Given 06/15/23 1955)  acetaminophen (TYLENOL) tablet 650 mg (has no administration in time range)  ondansetron (ZOFRAN) injection 4 mg (has no administration in time range)  furosemide (LASIX) injection 40 mg (40 mg Intravenous Given 06/15/23 1941)  heparin bolus via infusion 4,000 Units (4,000 Units Intravenous Bolus from Bag 06/15/23 1955)    Mobility walks with device     Focused Assessments Cardiac Assessment Handoff:    No results found for: "CKTOTAL", "CKMB", "CKMBINDEX", "TROPONINI" No results found for: "DDIMER" Does the Patient currently have chest pain? No    R Recommendations: See Admitting Provider Note  Report given to:   Additional Notes:  Pt is a&Ox4, ambulates with walker at baseline, continent, using purewick.

## 2023-06-15 NOTE — ED Triage Notes (Signed)
Pt presents stating she has afib. No hx of same.  States one of her BP cuffs told her she was in afib.  Pt's daughter states she had her watch on her and her HR went from 84s to 150s.  Pt's initial EKG appeared to be afib but then the final interpretation appears to be SVT.  Will prioritize rooming.

## 2023-06-15 NOTE — ED Provider Notes (Signed)
[facility] [facility]PARTMENT AT [facility] Provider Note   CSN: [id number] Arrival date & time: 06/15/23  1542     History  Chief Complaint  Patient presents with   Tachycardia    Glendora P Rahrig is a 82 y.o. female presenting to ED with palpitations, SOB.  Hx of HTN.  Reports dyspnea worsening for "a few days," noticed HR elevated today checking her BP.  No prior hx of A Fib.  No chest pain.    HPI     Home Medications Prior to Admission medications   Medication Sig Start Date End Date Taking? Authorizing Provider  amLODipine (NORVASC) 10 MG tablet Take 10 mg by mouth daily. 06/25/17  Yes [provider]  ARNICA EX Apply 1 application topically as needed (Arthritis Pain in fingers).   Yes [provider]  diphenhydrAMINE (BENADRYL) 25 mg capsule Take 25 mg by mouth at bedtime as needed for allergies.   Yes [provider]  Lactobacillus (ACIDOPHILUS PO) Take 1 tablet by mouth daily.   Yes [provider]  Pseudoephedrine HCl (SUDAFED CONGESTION PO) Take 1 tablet by mouth daily as needed (for congestion).   Yes [provider]      Allergies    Acetaminophen, Codeine, Gelatin, and Nsaids    Review of Systems   Review of Systems  Physical Exam Updated Vital Signs BP (!) 125/90   Pulse (!) 114   Temp 98.1 F (36.7 C) (Oral)   Resp 19   SpO2 99%  Physical Exam Constitutional:      General: She is not in acute distress. HENT:     Head: Normocephalic and atraumatic.  Eyes:     Conjunctiva/sclera: Conjunctivae normal.     Pupils: Pupils are equal, round, and reactive to light.  Cardiovascular:     Rate and Rhythm: Tachycardia present. Rhythm irregular.  Pulmonary:     Effort: Pulmonary effort is normal. No respiratory distress.  Abdominal:     General: There is no distension.     Tenderness: There is no abdominal tenderness.  Musculoskeletal:     Right lower leg: Edema present.     Left lower leg: Edema  present.  Skin:    General: Skin is warm and dry.  Neurological:     General: No focal deficit present.     Mental Status: She is alert. Mental status is at baseline.  Psychiatric:        Mood and Affect: Mood normal.        Behavior: Behavior normal.     ED Results / Procedures / Treatments   Labs (all labs ordered are listed, but only abnormal results are displayed) Labs Reviewed  BASIC METABOLIC PANEL - Abnormal; Notable for the following components:      Result Value   Glucose, Bld 101 (*)    All other components within normal limits  CBC - Abnormal; Notable for the following components:   WBC 12.3 (*)    All other components within normal limits  BRAIN NATRIURETIC PEPTIDE - Abnormal; Notable for the following components:   B Natriuretic Peptide 207.3 (*)    All other components within normal limits  SARS CORONAVIRUS 2 BY RT PCR  MAGNESIUM  TSH  HEPARIN LEVEL (UNFRACTIONATED)  CBC  BASIC METABOLIC PANEL    EKG EKG Interpretation Date/Time:  Saturday June 15 2023 17:35:34 EDT Ventricular Rate:  133 PR Interval:    QRS Duration:  72 QT Interval:  321 QTC Calculation:  478 R Axis:   70  Text Interpretation: Atrial fibrillation Anteroseptal infarct, age indeterminate Confirmed by Alvester Chou [phone]) on 06/15/2023 5:48:22 PM  Radiology DG Chest Port 1 View  Result Date: 06/15/2023 CLINICAL DATA:  Shortness of breath EXAM: PORTABLE CHEST 1 VIEW COMPARISON:  Chest x-ray 01/17/2017 FINDINGS: Heart is enlarged. There central pulmonary vascular congestion. Right costophrenic angle has been excluded. There is no large pleural effusion, focal lung infiltrate or pneumothorax. No acute fractures are seen. IMPRESSION: Cardiomegaly with central pulmonary vascular congestion. Electronically Signed   By: Darliss Cheney M.D.   On: 06/15/2023 17:28    Procedures .Critical Care  Performed by: Terald Sleeper, MD Authorized by: Terald Sleeper, MD   Critical care provider  statement:    Critical care time (minutes):  45   Critical care time was exclusive of:  Separately billable procedures and treating other patients   Critical care was necessary to treat or prevent imminent or life-threatening deterioration of the following conditions:  Circulatory failure   Critical care was time spent personally by me on the following activities:  Ordering and performing treatments and interventions, ordering and review of laboratory studies, ordering and review of radiographic studies, pulse oximetry, review of old charts, examination of patient and evaluation of patient's response to treatment     Medications Ordered in ED Medications  diltiazem (CARDIZEM) 1 mg/mL load via infusion 10 mg (10 mg Intravenous Bolus from Bag 06/15/23 1828)    And  diltiazem (CARDIZEM) 125 mg in dextrose 5% 125 mL (1 mg/mL) infusion (5 mg/hr Intravenous New Bag/Given 06/15/23 1828)  heparin ADULT infusion 100 units/mL (25000 units/262mL) (1,200 Units/hr Intravenous New Bag/Given 06/15/23 1955)  acetaminophen (TYLENOL) tablet 650 mg (has no administration in time range)  ondansetron (ZOFRAN) injection 4 mg (has no administration in time range)  diphenhydrAMINE (BENADRYL) capsule 25 mg (has no administration in time range)  furosemide (LASIX) injection 40 mg (40 mg Intravenous Given 06/15/23 1941)  heparin bolus via infusion 4,000 Units (4,000 Units Intravenous Bolus from Bag 06/15/23 1955)    ED Course/ Medical Decision Making/ A&P                             Medical Decision Making Amount and/or Complexity of Data Reviewed Labs: ordered. Radiology: ordered.  Risk Prescription drug management. Decision regarding hospitalization.   This patient presents to the ED with concern for SOB, tachycardia. This involves an extensive number of treatment options, and is a complaint that carries with it a high risk of complications and morbidity.  The differential diagnosis includes new onset A-fib  versus heart failure versus arrhythmia versus anemia versus other  Co-morbidities that complicate the patient evaluation: Age and hypertension cardiovascular risk factors for arrhythmia  Additional history obtained from patient's children at the bedside  I ordered and personally interpreted labs.  The pertinent results include: BNP elevated at 207.  White blood cell count 12.3.  I ordered imaging studies including x-ray of the chest I independently visualized and interpreted imaging which showed mild cardiomegaly and pulmonary edema findings I agree with the radiologist interpretation  The patient was maintained on a cardiac monitor.  I personally viewed and interpreted the cardiac monitored which showed an underlying rhythm of: A-fib with RVR  Per my interpretation the patient's ECG shows A-fib heart rate approximately 130  I ordered medication including IV diltiazem infusion for A-fib rate control, IV lasix for diuresis  I  have reviewed the patients home medicines and have made adjustments as needed  Test Considered: Low suspicion for acute PE in this clinical setting  After the interventions noted above, I reevaluated the patient and found that they have: improved - HR improved  Dispostion:  After consideration of the diagnostic results and the patients response to treatment, I feel that the patent would benefit from medical admission         Final Clinical Impression(s) / ED Diagnoses Final diagnoses:  Atrial fibrillation with RVR [facility])    Rx / DC Orders ED Discharge Orders          Ordered    Amb referral to AFIB Clinic        06/15/23 1645              Siriah Treat, Kermit Balo, MD 06/15/23 2325

## 2023-06-16 ENCOUNTER — Observation Stay (HOSPITAL_BASED_OUTPATIENT_CLINIC_OR_DEPARTMENT_OTHER): Payer: 59

## 2023-06-16 DIAGNOSIS — I10 Essential (primary) hypertension: Secondary | ICD-10-CM | POA: Diagnosis not present

## 2023-06-16 DIAGNOSIS — E0781 Sick-euthyroid syndrome: Secondary | ICD-10-CM

## 2023-06-16 DIAGNOSIS — I4891 Unspecified atrial fibrillation: Secondary | ICD-10-CM | POA: Diagnosis not present

## 2023-06-16 DIAGNOSIS — E876 Hypokalemia: Secondary | ICD-10-CM | POA: Diagnosis not present

## 2023-06-16 DIAGNOSIS — I509 Heart failure, unspecified: Secondary | ICD-10-CM | POA: Diagnosis not present

## 2023-06-16 LAB — CBC
HCT: 38.3 % (ref 36.0–46.0)
Hemoglobin: 12.3 g/dL (ref 12.0–15.0)
MCH: 27.8 pg (ref 26.0–34.0)
MCHC: 32.1 g/dL (ref 30.0–36.0)
MCV: 86.7 fL (ref 80.0–100.0)
Platelets: 262 10*3/uL (ref 150–400)
RBC: 4.42 MIL/uL (ref 3.87–5.11)
RDW: 13.7 % (ref 11.5–15.5)
WBC: 10 10*3/uL (ref 4.0–10.5)
nRBC: 0 % (ref 0.0–0.2)

## 2023-06-16 LAB — ECHOCARDIOGRAM COMPLETE
AR max vel: 1.9 cm2
AV Area VTI: 2.22 cm2
AV Area mean vel: 1.77 cm2
AV Mean grad: 5.3 mmHg
AV Peak grad: 9.2 mmHg
Ao pk vel: 1.52 m/s
Area-P 1/2: 4.17 cm2
Height: 61 in
S' Lateral: 3 cm
Weight: 3764.8 oz

## 2023-06-16 LAB — BASIC METABOLIC PANEL
Anion gap: 9 (ref 5–15)
BUN: 16 mg/dL (ref 8–23)
CO2: 26 mmol/L (ref 22–32)
Calcium: 9 mg/dL (ref 8.9–10.3)
Chloride: 102 mmol/L (ref 98–111)
Creatinine, Ser: 0.76 mg/dL (ref 0.44–1.00)
GFR, Estimated: >60 mL/min (ref >60)
Glucose, Bld: 118 mg/dL — ABNORMAL HIGH (ref 70–99)
Potassium: 3.2 mmol/L — ABNORMAL LOW (ref 3.5–5.1)
Sodium: 137 mmol/L (ref 135–145)

## 2023-06-16 LAB — HEPARIN LEVEL (UNFRACTIONATED)
Heparin Unfractionated: 0.11 IU/mL — ABNORMAL LOW (ref 0.30–0.70)
Heparin Unfractionated: 0.14 IU/mL — ABNORMAL LOW (ref 0.30–0.70)

## 2023-06-16 LAB — MAGNESIUM: Magnesium: 2 mg/dL (ref 1.7–2.4)

## 2023-06-16 MED ORDER — POTASSIUM CHLORIDE CRYS ER 20 MEQ PO TBCR
40.0000 meq | EXTENDED_RELEASE_TABLET | ORAL | Status: AC
  Administered 2023-06-16 (×2): 40 meq via ORAL
  Filled 2023-06-16 (×2): qty 2

## 2023-06-16 MED ORDER — DIGOXIN 0.25 MG/ML IJ SOLN
0.1250 mg | Freq: Once | INTRAMUSCULAR | Status: AC
  Administered 2023-06-16: 0.125 mg via INTRAVENOUS
  Filled 2023-06-16: qty 0.5

## 2023-06-16 MED ORDER — METOPROLOL TARTRATE 12.5 MG HALF TABLET
12.5000 mg | ORAL_TABLET | Freq: Two times a day (BID) | ORAL | Status: DC
  Administered 2023-06-16: 12.5 mg via ORAL
  Filled 2023-06-16: qty 1

## 2023-06-16 NOTE — Care Management Obs Status (Signed)
MEDICARE OBSERVATION STATUS NOTIFICATION   Patient Details  Name: Bailey Solomon MRN: [id number] Date of Birth: [birth year]   Medicare Observation Status Notification Given:  Yes    Ronny Bacon, RN 06/16/2023, 3:49 PM

## 2023-06-16 NOTE — Plan of Care (Signed)

## 2023-06-16 NOTE — Progress Notes (Addendum)
PROGRESS NOTE  Bailey Solomon ZOX:[id number] DOB: 06/16/1941   PCP: Kaleen Mask, MD  Patient is from: Home. Lives with son.  Uses rollator at baseline  DOA: 06/15/2023 LOS: 0  Chief complaints Chief Complaint  Patient presents with   Tachycardia     Brief Narrative / Interim history: 82 year old F with PMH of HTN, HLD, prior smoking and morbid obesity presenting with palpitation and shortness of breath for few days and admitted with new onset A-fib with RVR and possible CHF.  Patient was in RVR to 138 on arrival.  CXR showed pulmonary vascular congestion.  BNP was elevated to 207.  Started on Cardizem drip, IV heparin and IV Lasix.  Heart rate improved.  Echocardiogram ordered.  Admitted for further care.  Subjective: Seen and examined earlier this morning.  No major events overnight of this morning.  Heart rate improved to 80s and 90s but Cardizem drip at 15.  No complaints this morning.  She denies chest pain, dyspnea, palpitation or dizziness.  Soft BP.  Objective: Vitals:   06/16/23 0449 06/16/23 0808 06/16/23 1132 06/16/23 1256  BP: 120/80 (!) 90/47 (!) 86/54 112/63  Pulse: 85 99 (!) 105 (!) 114  Resp:  14    Temp:  98.5 F (36.9 C)    TempSrc:  Oral    SpO2: 96% 97% 97% 97%  Weight:      Height:        Examination:  GENERAL: No apparent distress.  Nontoxic. HEENT: MMM.  Vision and hearing grossly intact.  NECK: Supple.  No apparent JVD.  RESP:  No IWOB.  Fair aeration bilaterally. CVS: Irregular rhythm.  HR in 80s to 90s.  Heart sounds normal.  ABD/GI/GU: BS+. Abd soft, NTND.  MSK/EXT:  Moves extremities. No apparent deformity. No edema.  SKIN: no apparent skin lesion or wound NEURO: Awake, alert and oriented appropriately.  No apparent focal neuro deficit. PSYCH: Calm. Normal affect.   Procedures:  None  Microbiology summarized: None  Assessment and plan: Principal Problem:   Atrial fibrillation with RVR (HCC) Active Problems:   New onset  of congestive heart failure (HCC)   HTN (hypertension)  New onset atrial fibrillation with RVR: Presented with RVR to 130s.  Started on Cardizem drip.  HR improved to 80s and 90s.  Unclear cause of A-fib but concern about CHF.  No alcohol or excess caffeine.  TSH normal.  CHA2DS2-VASc score 4, could be 5 if TTE confirms CHF. -Continue Cardizem drip and transition when appropriate -Need anticoagulation for some time before considering cardioversion by cardiology. -Continue IV heparin -Optimize electrolytes, K and Mg -Follow-up echocardiogram  Possible new onset CHF: Presented with SOB.  X-ray suggested vascular congestion.  BNP 207 but could be falsely low due to morbid obesity.  Had peripheral edema on presentation that seems to have resolved.  Received IV Lasix in ED. -Hold further diuretics due to soft BP.  Appears euvolemic as well.  Has no respiratory distress. -Strict intake and output, daily weight, renal functions and electrolytes -Follow echocardiogram  Essential hypertension: Soft BP today.  On amlodipine at home. -Continue Cardizem as above  Hypokalemia -Replenish and recheck.  Mag within normal.  Morbid obesity Body mass index is 44.46 kg/m. -Encourage lifestyle change to lose weight.           DVT prophylaxis:  On full dose anticoagulation  Code Status: DNR/DNI Family Communication: None at bedside Level of care: Telemetry Cardiac Status is: Observation The patient will require care  spanning > 2 midnights and should be moved to inpatient because: New onset A-fib with RVR and new onset CHF   Final disposition: Likely home Consultants:  None  55 minutes with more than 50% spent in reviewing records, counseling patient/family and coordinating care.   Sch Meds:  Scheduled Meds:  digoxin  0.125 mg Intravenous Once   Continuous Infusions:  diltiazem (CARDIZEM) infusion 7.5 mg/hr (06/16/23 1339)   heparin 1,450 Units/hr (06/16/23 1231)   PRN  Meds:.acetaminophen, diphenhydrAMINE, ondansetron (ZOFRAN) IV  Antimicrobials: Anti-infectives (From admission, onward)    None        I have personally reviewed the following labs and images: CBC: Recent Labs  Lab 06/15/23 1832 06/16/23 0557  WBC 12.3* 10.0  HGB 12.5 12.3  HCT 39.2 38.3  MCV 86.9 86.7  PLT 264 262   BMP &GFR Recent Labs  Lab 06/15/23 1832 06/16/23 0557  NA 138 137  K 3.7 3.2*  CL 104 102  CO2 25 26  GLUCOSE 101* 118*  BUN 16 16  CREATININE 0.75 0.76  CALCIUM 9.5 9.0  MG 2.1 2.0   Estimated Creatinine Clearance: 63.2 mL/min (by C-G formula based on SCr of 0.76 mg/dL). Liver & Pancreas: No results for input(s): "AST", "ALT", "ALKPHOS", "BILITOT", "PROT", "ALBUMIN" in the last 168 hours. No results for input(s): "LIPASE", "AMYLASE" in the last 168 hours. No results for input(s): "AMMONIA" in the last 168 hours. Diabetic: No results for input(s): "HGBA1C" in the last 72 hours. No results for input(s): "GLUCAP" in the last 168 hours. Cardiac Enzymes: No results for input(s): "CKTOTAL", "CKMB", "CKMBINDEX", "TROPONINI" in the last 168 hours. No results for input(s): "PROBNP" in the last 8760 hours. Coagulation Profile: No results for input(s): "INR", "PROTIME" in the last 168 hours. Thyroid Function Tests: Recent Labs    06/15/23 1832  TSH 2.361   Lipid Profile: No results for input(s): "CHOL", "HDL", "LDLCALC", "TRIG", "CHOLHDL", "LDLDIRECT" in the last 72 hours. Anemia Panel: No results for input(s): "VITAMINB12", "FOLATE", "FERRITIN", "TIBC", "IRON", "RETICCTPCT" in the last 72 hours. Urine analysis:    Component Value Date/Time   COLORURINE YELLOW 06/29/2017 1305   APPEARANCEUR CLEAR 06/29/2017 1305   LABSPEC 1.003 (L) 06/29/2017 1305   PHURINE 7.0 06/29/2017 1305   GLUCOSEU NEGATIVE 06/29/2017 1305   HGBUR NEGATIVE 06/29/2017 1305   BILIRUBINUR NEGATIVE 06/29/2017 1305   KETONESUR NEGATIVE 06/29/2017 1305   PROTEINUR NEGATIVE  06/29/2017 1305   UROBILINOGEN 1.0 07/26/2014 1131   NITRITE NEGATIVE 06/29/2017 1305   LEUKOCYTESUR NEGATIVE 06/29/2017 1305   Sepsis Labs: Invalid input(s): "PROCALCITONIN", "LACTICIDVEN"  Microbiology: Recent Results (from the past 240 hour(s))  SARS Coronavirus 2 by RT PCR (hospital order, performed in [facility] hospital lab) *cepheid single result test* Anterior Nasal Swab     Status: None   Collection Time: 06/15/23  4:47 PM   Specimen: Anterior Nasal Swab  Result Value Ref Range Status   SARS Coronavirus 2 by RT PCR NEGATIVE NEGATIVE Final    Comment: Performed at [facility], [address] [place], [place] [address]    Radiology Studies: DG Chest Port 1 View  Result Date: 06/15/2023 CLINICAL DATA:  Shortness of breath EXAM: PORTABLE CHEST 1 VIEW COMPARISON:  Chest x-ray 01/17/2017 FINDINGS: Heart is enlarged. There central pulmonary vascular congestion. Right costophrenic angle has been excluded. There is no large pleural effusion, focal lung infiltrate or pneumothorax. No acute fractures are seen. IMPRESSION: Cardiomegaly with central pulmonary vascular congestion. Electronically Signed  By: Darliss Cheney M.D.   On: 06/15/2023 17:28      Kieryn Burtis T. Khiyan Crace [facility]  If 7PM-7AM, please contact night-coverage [email or web address] 06/16/2023, 3:02 PM

## 2023-06-16 NOTE — Progress Notes (Signed)
ANTICOAGULATION CONSULT NOTE - Follow-Up Consult  Pharmacy Consult for Heparin Indication: atrial fibrillation  Allergies  Allergen Reactions   Acetaminophen     Constipation, prefers not to take due to this   Codeine Nausea And Vomiting   Gelatin Other (See Comments)    Gel/capsules - unable to swallow them, "get stuck in throat"   Nsaids Other (See Comments)    Constipation    Patient Measurements: Height: 5\' 1"  (154.9 cm) Weight: 106.7 kg (235 lb 4.8 oz) IBW/kg (Calculated) : 47.8 Heparin Dosing Weight: 75 kg  Vital Signs: Temp: 99.2 F (37.3 C) (07/21 1650) Temp Source: Oral (07/21 1650) BP: 120/80 (07/21 1919) Pulse Rate: 75 (07/21 1919)  Labs: Recent Labs    06/15/23 1832 06/16/23 0557 06/16/23 1855  HGB 12.5 12.3  --   HCT 39.2 38.3  --   PLT 264 262  --   HEPARINUNFRC  --  0.11* 0.14*  CREATININE 0.75 0.76  --     Estimated Creatinine Clearance: 63.2 mL/min (by C-G formula based on SCr of 0.76 mg/dL).   Medical History: Past Medical History:  Diagnosis Date   Anxiety    Arthritis    "qwhere"   Chronic bronchitis (HCC)    "quit after I stopped smoking"   Depression    Exertional shortness of breath    Hyperlipidemia    Hypertension    Obesity    Prediabetes    Shingles     Medications:  Medications Prior to Admission  Medication Sig Dispense Refill Last Dose   amLODipine (NORVASC) 10 MG tablet Take 10 mg by mouth daily.   06/15/2023 at am   ARNICA EX Apply 1 application topically as needed (Arthritis Pain in fingers).   Past Week   diphenhydrAMINE (BENADRYL) 25 mg capsule Take 25 mg by mouth at bedtime as needed for allergies.   Past Week   Lactobacillus (ACIDOPHILUS PO) Take 1 tablet by mouth daily.   Past Week   Pseudoephedrine HCl (SUDAFED CONGESTION PO) Take 1 tablet by mouth daily as needed (for congestion).   Past Week   Scheduled:   metoprolol tartrate  12.5 mg Oral BID   Infusions:   diltiazem (CARDIZEM) infusion 6 mg/hr  (06/16/23 1507)   heparin 1,450 Units/hr (06/16/23 1231)   PRN: acetaminophen, diphenhydrAMINE, ondansetron (ZOFRAN) IV  Assessment: 81 yof with a history of HTN. Patient is presenting with dyspnea. Heparin per pharmacy consult placed for atrial fibrillation. Patient was not on anticoagulation prior to arrival. Initiating anticoagulation with heparin prior to possible cardioversion by cardiology.   Heparin level tonight came back subtherapeutic at 0.14, on 1450 units/hr. No s/sx of bleeding or infusion issues.   Goal of Therapy:  Heparin level 0.3-0.7 units/ml Monitor platelets by anticoagulation protocol: Yes   Plan:  Increase heparin infusion to 1700 units/hr Check anti-Xa level in 8 hours and daily while on heparin Continue to monitor H&H and platelets F/u on plans to transition to PO [facility]  Thank you for allowing pharmacy to participate in this patient's care,  Sherron Monday, PharmD, BCCCP Clinical Pharmacist  Phone: [phone] 06/16/2023 7:32 PM  Please check AMION for all [facility] Pharmacy phone numbers After 10:00 PM, call Main Pharmacy [phone]

## 2023-06-16 NOTE — Progress Notes (Addendum)
ANTICOAGULATION CONSULT NOTE - Follow-Up Consult  Pharmacy Consult for Heparin Indication: atrial fibrillation  Allergies  Allergen Reactions   Acetaminophen     Constipation, prefers not to take due to this   Codeine Nausea And Vomiting   Gelatin Other (See Comments)    Gel/capsules - unable to swallow them, "get stuck in throat"   Nsaids Other (See Comments)    Constipation    Patient Measurements: Height: 5\' 1"  (154.9 cm) Weight: 106.7 kg (235 lb 4.8 oz) IBW/kg (Calculated) : 47.8 Heparin Dosing Weight: 75 kg  Vital Signs: Temp: 98.5 F (36.9 C) (07/21 0808) Temp Source: Oral (07/21 0808) BP: 90/47 (07/21 0808) Pulse Rate: 99 (07/21 0808)  Labs: Recent Labs    06/15/23 1832 06/16/23 0557  HGB 12.5 12.3  HCT 39.2 38.3  PLT 264 262  HEPARINUNFRC  --  0.11*  CREATININE 0.75 0.76    Estimated Creatinine Clearance: 63.2 mL/min (by C-G formula based on SCr of 0.76 mg/dL).   Medical History: Past Medical History:  Diagnosis Date   Anxiety    Arthritis    "qwhere"   Chronic bronchitis (HCC)    "quit after I stopped smoking"   Depression    Exertional shortness of breath    Hyperlipidemia    Hypertension    Obesity    Prediabetes    Shingles     Medications:  Medications Prior to Admission  Medication Sig Dispense Refill Last Dose   amLODipine (NORVASC) 10 MG tablet Take 10 mg by mouth daily.   06/15/2023 at am   ARNICA EX Apply 1 application topically as needed (Arthritis Pain in fingers).   Past Week   diphenhydrAMINE (BENADRYL) 25 mg capsule Take 25 mg by mouth at bedtime as needed for allergies.   Past Week   Lactobacillus (ACIDOPHILUS PO) Take 1 tablet by mouth daily.   Past Week   Pseudoephedrine HCl (SUDAFED CONGESTION PO) Take 1 tablet by mouth daily as needed (for congestion).   Past Week   Scheduled:   potassium chloride  40 mEq Oral Q3H   Infusions:   diltiazem (CARDIZEM) infusion 15 mg/hr (06/16/23 0455)   heparin 1,200 Units/hr  (06/15/23 1955)   PRN: acetaminophen, diphenhydrAMINE, ondansetron (ZOFRAN) IV  Assessment: 81 yof with a history of HTN. Patient is presenting with dyspnea. Heparin per pharmacy consult placed for atrial fibrillation. Patient was not on anticoagulation prior to arrival. Initiating anticoagulation with heparin prior to possible cardioversion by cardiology.   7/21 AM:  Heparin level subtherapeutic at 0.11 on 1200 units/hr. No issues with infusion running or bleeding reported by RN. CBC stable this morning (Hgb 12.3; plt 262).  Goal of Therapy:  Heparin level 0.3-0.7 units/ml Monitor platelets by anticoagulation protocol: Yes   Plan:  Increase heparin infusion to 1450 units/hr Check anti-Xa level in 8 hours and daily while on heparin Continue to monitor H&H and platelets F/u on plans to transition to PO [facility]  Enos Fling, PharmD PGY-1 Acute Care Pharmacy Resident 06/16/2023 10:04 AM

## 2023-06-16 NOTE — Plan of Care (Signed)

## 2023-06-16 NOTE — Progress Notes (Signed)
*  PRELIMINARY RESULTS* Echocardiogram 2D Echocardiogram has been performed.  [name] 06/16/2023, 3:52 PM

## 2023-06-17 ENCOUNTER — Other Ambulatory Visit (HOSPITAL_COMMUNITY): Payer: Self-pay

## 2023-06-17 DIAGNOSIS — I480 Paroxysmal atrial fibrillation: Secondary | ICD-10-CM | POA: Diagnosis present

## 2023-06-17 DIAGNOSIS — I5021 Acute systolic (congestive) heart failure: Secondary | ICD-10-CM | POA: Diagnosis present

## 2023-06-17 DIAGNOSIS — Z91018 Allergy to other foods: Secondary | ICD-10-CM | POA: Diagnosis not present

## 2023-06-17 DIAGNOSIS — Z7901 Long term (current) use of anticoagulants: Secondary | ICD-10-CM | POA: Diagnosis not present

## 2023-06-17 DIAGNOSIS — I420 Dilated cardiomyopathy: Secondary | ICD-10-CM | POA: Diagnosis present

## 2023-06-17 DIAGNOSIS — E785 Hyperlipidemia, unspecified: Secondary | ICD-10-CM | POA: Diagnosis present

## 2023-06-17 DIAGNOSIS — I4892 Unspecified atrial flutter: Secondary | ICD-10-CM | POA: Diagnosis present

## 2023-06-17 DIAGNOSIS — Z66 Do not resuscitate: Secondary | ICD-10-CM | POA: Diagnosis present

## 2023-06-17 DIAGNOSIS — Z1152 Encounter for screening for COVID-19: Secondary | ICD-10-CM | POA: Diagnosis not present

## 2023-06-17 DIAGNOSIS — F17210 Nicotine dependence, cigarettes, uncomplicated: Secondary | ICD-10-CM | POA: Diagnosis present

## 2023-06-17 DIAGNOSIS — I4891 Unspecified atrial fibrillation: Secondary | ICD-10-CM | POA: Diagnosis present

## 2023-06-17 DIAGNOSIS — I10 Essential (primary) hypertension: Secondary | ICD-10-CM | POA: Diagnosis not present

## 2023-06-17 DIAGNOSIS — Z6841 Body Mass Index (BMI) 40.0 and over, adult: Secondary | ICD-10-CM | POA: Diagnosis not present

## 2023-06-17 DIAGNOSIS — Z886 Allergy status to analgesic agent status: Secondary | ICD-10-CM | POA: Diagnosis not present

## 2023-06-17 DIAGNOSIS — I340 Nonrheumatic mitral (valve) insufficiency: Secondary | ICD-10-CM | POA: Diagnosis not present

## 2023-06-17 DIAGNOSIS — Z87891 Personal history of nicotine dependence: Secondary | ICD-10-CM | POA: Diagnosis not present

## 2023-06-17 DIAGNOSIS — Z8249 Family history of ischemic heart disease and other diseases of the circulatory system: Secondary | ICD-10-CM | POA: Diagnosis not present

## 2023-06-17 DIAGNOSIS — E876 Hypokalemia: Secondary | ICD-10-CM | POA: Diagnosis not present

## 2023-06-17 DIAGNOSIS — Z7982 Long term (current) use of aspirin: Secondary | ICD-10-CM | POA: Diagnosis not present

## 2023-06-17 DIAGNOSIS — Z96651 Presence of right artificial knee joint: Secondary | ICD-10-CM | POA: Diagnosis present

## 2023-06-17 DIAGNOSIS — I110 Hypertensive heart disease with heart failure: Secondary | ICD-10-CM | POA: Diagnosis present

## 2023-06-17 DIAGNOSIS — I509 Heart failure, unspecified: Secondary | ICD-10-CM | POA: Diagnosis not present

## 2023-06-17 DIAGNOSIS — Z885 Allergy status to narcotic agent status: Secondary | ICD-10-CM | POA: Diagnosis not present

## 2023-06-17 DIAGNOSIS — Z79899 Other long term (current) drug therapy: Secondary | ICD-10-CM | POA: Diagnosis not present

## 2023-06-17 DIAGNOSIS — M159 Polyosteoarthritis, unspecified: Secondary | ICD-10-CM | POA: Diagnosis present

## 2023-06-17 DIAGNOSIS — R7303 Prediabetes: Secondary | ICD-10-CM | POA: Diagnosis present

## 2023-06-17 LAB — HEPARIN LEVEL (UNFRACTIONATED): Heparin Unfractionated: 0.33 IU/mL (ref 0.30–0.70)

## 2023-06-17 LAB — RENAL FUNCTION PANEL
Albumin: 3.3 g/dL — ABNORMAL LOW (ref 3.5–5.0)
Anion gap: 9 (ref 5–15)
BUN: 18 mg/dL (ref 8–23)
CO2: 24 mmol/L (ref 22–32)
Calcium: 9.1 mg/dL (ref 8.9–10.3)
Chloride: 104 mmol/L (ref 98–111)
Creatinine, Ser: 0.78 mg/dL (ref 0.44–1.00)
GFR, Estimated: >60 mL/min (ref >60)
Glucose, Bld: 144 mg/dL — ABNORMAL HIGH (ref 70–99)
Phosphorus: 2.4 mg/dL — ABNORMAL LOW (ref 2.5–4.6)
Potassium: 3.9 mmol/L (ref 3.5–5.1)
Sodium: 137 mmol/L (ref 135–145)

## 2023-06-17 LAB — MAGNESIUM: Magnesium: 2 mg/dL (ref 1.7–2.4)

## 2023-06-17 LAB — CBC
HCT: 40.2 % (ref 36.0–46.0)
Hemoglobin: 12.7 g/dL (ref 12.0–15.0)
MCH: 28.1 pg (ref 26.0–34.0)
MCHC: 31.6 g/dL (ref 30.0–36.0)
MCV: 88.9 fL (ref 80.0–100.0)
Platelets: 238 10*3/uL (ref 150–400)
RBC: 4.52 MIL/uL (ref 3.87–5.11)
RDW: 13.8 % (ref 11.5–15.5)
WBC: 10.6 10*3/uL — ABNORMAL HIGH (ref 4.0–10.5)
nRBC: 0 % (ref 0.0–0.2)

## 2023-06-17 MED ORDER — METOPROLOL TARTRATE 5 MG/5ML IV SOLN
2.5000 mg | INTRAVENOUS | Status: DC | PRN
  Administered 2023-06-17: 2.5 mg via INTRAVENOUS
  Filled 2023-06-17: qty 5

## 2023-06-17 MED ORDER — MELATONIN 3 MG PO TABS
6.0000 mg | ORAL_TABLET | Freq: Every day | ORAL | Status: DC
  Administered 2023-06-17 – 2023-06-19 (×3): 6 mg via ORAL
  Filled 2023-06-17 (×3): qty 2

## 2023-06-17 MED ORDER — METOPROLOL SUCCINATE ER 50 MG PO TB24
50.0000 mg | ORAL_TABLET | Freq: Every day | ORAL | Status: DC
  Administered 2023-06-17: 50 mg via ORAL
  Filled 2023-06-17: qty 1

## 2023-06-17 MED ORDER — METOPROLOL SUCCINATE ER 50 MG PO TB24
50.0000 mg | ORAL_TABLET | Freq: Two times a day (BID) | ORAL | Status: DC
  Administered 2023-06-17 – 2023-06-18 (×2): 50 mg via ORAL
  Filled 2023-06-17 (×2): qty 1

## 2023-06-17 MED ORDER — METOPROLOL TARTRATE 25 MG PO TABS: 25.0000 mg | ORAL_TABLET | Freq: Two times a day (BID) | ORAL | Status: DC

## 2023-06-17 MED ORDER — MENTHOL 3 MG MT LOZG: 1.0000 | LOZENGE | OROMUCOSAL | Status: DC | PRN

## 2023-06-17 MED ORDER — EMPAGLIFLOZIN 10 MG PO TABS
10.0000 mg | ORAL_TABLET | Freq: Every day | ORAL | Status: DC
  Administered 2023-06-18: 10 mg via ORAL
  Filled 2023-06-17 (×2): qty 1

## 2023-06-17 MED ORDER — METOPROLOL SUCCINATE ER 25 MG PO TB24: 25.0000 mg | ORAL_TABLET | Freq: Every day | ORAL | Status: DC

## 2023-06-17 MED ORDER — APIXABAN 5 MG PO TABS
5.0000 mg | ORAL_TABLET | Freq: Two times a day (BID) | ORAL | Status: DC
  Administered 2023-06-17 – 2023-06-20 (×7): 5 mg via ORAL
  Filled 2023-06-17 (×7): qty 1

## 2023-06-17 NOTE — Evaluation (Signed)
Occupational Therapy Evaluation Patient Details Name: Bailey Solomon MRN: [id number] DOB: Aug 26, 1941 Today's Date: 06/17/2023   History of Present Illness 82 year old F presenting with palpitation and shortness of breath and admitted with new onset A-fib with RVR and possible CH. PMH of HTN, HLD, prior smoking and morbid obesity   Clinical Impression   PTA pt lives independently in her home (son lives with her); uses a cane at times for mobility (furniture walks in the house and uses a cane for steps) and is independent with ADL and IADL tasks. Pt appears close to her baseline level. HR 120s-140s throughout and SpO2 91-94 on RA with 1/4 DOE noted.  Pt states she plans to DC home today. Agreeable to HHPT. Acute OT will follow however no follow up OT recommended.      Recommendations for follow up therapy are one component of a multi-disciplinary discharge planning process, led by the attending physician.  Recommendations may be updated based on patient status, additional functional criteria and insurance authorization.   Assistance Recommended at Discharge Intermittent Supervision/Assistance  Patient can return home with the following Assist for transportation;Assistance with cooking/housework    Functional Status Assessment  Patient has had a recent decline in their functional status and demonstrates the ability to make significant improvements in function in a reasonable and predictable amount of time.  Equipment Recommendations  None recommended by OT    Recommendations for Other Services       Precautions / Restrictions Precautions Precautions: Fall Precaution Comments: watch HR      Mobility Bed Mobility Overal bed mobility: Needs Assistance Bed Mobility: Supine to Sit     Supine to sit: Min assist          Transfers Overall transfer level: Needs assistance   Transfers: Sit to/from Stand Sit to Stand: Supervision           General transfer comment:  initially more unstead; states she has not been OOB since being in the hospital      Balance Overall balance assessment: History of Falls, Mild deficits observed, not formally tested                                         ADL either performed or assessed with clinical judgement   ADL Overall ADL's : At baseline                                       General ADL Comments: close to baseline; able to transfer to toilet with S adn complete pericare; no physical assist needed     Vision Baseline Vision/History: 1 Wears glasses;6 Macular Degeneration Ability to See in Adequate Light: 0 Adequate       Perception     Praxis      Pertinent Vitals/Pain       Hand Dominance Right   Extremity/Trunk Assessment Upper Extremity Assessment Upper Extremity Assessment: Overall WFL for tasks assessed (hx of shoulder issues but funcitonal; L elbow soreness from fall)   Lower Extremity Assessment Lower Extremity Assessment: Defer to PT evaluation   Cervical / Trunk Assessment Cervical / Trunk Assessment: Other exceptions (back pain  - chronic; increased body habitus)   Communication     Cognition Arousal/Alertness: Awake/alert Behavior During Therapy: WFL for tasks assessed/performed Overall Cognitive Status: Within  Functional Limits for tasks assessed                                       General Comments       Exercises     Shoulder Instructions      Home Living Family/patient expects to be discharged to:: Private residence Living Arrangements: Children Available Help at Discharge: Available PRN/intermittently;Personal care attendant Type of Home: House Home Access: Stairs to enter [employer] of Steps: 2 Entrance Stairs-Rails: None Home Layout: Two level;Able to live on main level with bedroom/bathroom Alternate Level Stairs-Number of Steps: flight Alternate Level Stairs-Rails: Right Bathroom Shower/Tub:  Walk-in shower   Bathroom Toilet: Handicapped height Bathroom Accessibility: No   Home Equipment: Agricultural consultant (2 wheels);Rollator (4 wheels);Cane - single point;Shower seat;Hand held shower head          Prior Functioning/Environment Prior Level of Function : Independent/Modified Independent;History of Falls (last six months)             Mobility Comments: slid off of her rollator in the hospital lobby this admission ADLs Comments: uses her reacher oftin; has a HHA 2x/month for cleaning only        OT Problem List: Obesity;Cardiopulmonary status limiting activity      OT Treatment/Interventions: Self-care/ADL training;Therapeutic exercise;Energy conservation;DME and/or AE instruction;Therapeutic activities;Patient/family education    OT Goals(Current goals can be found in the care plan section) Acute Rehab OT Goals Patient Stated Goal: to go home today OT Goal Formulation: With patient Time For Goal Achievement: 07/01/23 Potential to Achieve Goals: Good  OT Frequency: Min 1X/week    Co-evaluation              AM-PAC OT "6 Clicks" Daily Activity     Outcome Measure Help from another person eating meals?: None Help from another person taking care of personal grooming?: None Help from another person toileting, which includes using toliet, bedpan, or urinal?: A Little Help from another person bathing (including washing, rinsing, drying)?: None Help from another person to put on and taking off regular upper body clothing?: None Help from another person to put on and taking off regular lower body clothing?: None 6 Click Score: 23   End of Session Equipment Utilized During Treatment: Gait belt;Other (comment) (occasional use of cane; furniture walking) Nurse Communication: Mobility status;Other (comment) (HR)  Activity Tolerance: Patient tolerated treatment well Patient left: in chair;with call bell/phone within reach  OT Visit Diagnosis: Unsteadiness on feet  (R26.81)                Time: 6295-2841 OT Time Calculation (min): 31 min Charges:  OT General Charges $OT Visit: 1 Visit OT Evaluation $OT Eval Low Complexity: 1 Low OT Treatments $Self Care/Home Management : 8-22 mins  Luisa Dago, OT/L   Acute OT Clinical Specialist Acute Rehabilitation Services Pager [phone] Office [phone]   [facility] 06/17/2023, 10:02 AM

## 2023-06-17 NOTE — Discharge Instructions (Signed)

## 2023-06-17 NOTE — Progress Notes (Signed)
ANTICOAGULATION CONSULT NOTE - Follow-Up Consult  Pharmacy Consult for Heparin Indication: atrial fibrillation  Allergies  Allergen Reactions   Acetaminophen     Constipation, prefers not to take due to this   Codeine Nausea And Vomiting   Gelatin Other (See Comments)    Gel/capsules - unable to swallow them, "get stuck in throat"   Nsaids Other (See Comments)    Constipation    Patient Measurements: Height: 5\' 1"  (154.9 cm) Weight: 106.7 kg (235 lb 4.8 oz) IBW/kg (Calculated) : 47.8 Heparin Dosing Weight: 75 kg  Vital Signs: Temp: 98.2 F (36.8 C) (07/22 0447) Temp Source: Oral (07/22 0447) BP: 139/61 (07/22 0447) Pulse Rate: 84 (07/22 0447)  Labs: Recent Labs    06/15/23 1832 06/16/23 0557 06/16/23 1855 06/17/23 0426  HGB 12.5 12.3  --  12.7  HCT 39.2 38.3  --  40.2  PLT 264 262  --  238  HEPARINUNFRC  --  0.11* 0.14* 0.33  CREATININE 0.75 0.76  --  0.78    Estimated Creatinine Clearance: 63.2 mL/min (by C-G formula based on SCr of 0.78 mg/dL).   Medical History: Past Medical History:  Diagnosis Date   Anxiety    Arthritis    "qwhere"   Chronic bronchitis (HCC)    "quit after I stopped smoking"   Depression    Exertional shortness of breath    Hyperlipidemia    Hypertension    Obesity    Prediabetes    Shingles     Medications:  Medications Prior to Admission  Medication Sig Dispense Refill Last Dose   amLODipine (NORVASC) 10 MG tablet Take 10 mg by mouth daily.   06/15/2023 at am   ARNICA EX Apply 1 application topically as needed (Arthritis Pain in fingers).   Past Week   diphenhydrAMINE (BENADRYL) 25 mg capsule Take 25 mg by mouth at bedtime as needed for allergies.   Past Week   Lactobacillus (ACIDOPHILUS PO) Take 1 tablet by mouth daily.   Past Week   Pseudoephedrine HCl (SUDAFED CONGESTION PO) Take 1 tablet by mouth daily as needed (for congestion).   Past Week   Scheduled:   metoprolol tartrate  12.5 mg Oral BID   Infusions:    diltiazem (CARDIZEM) infusion 5 mg/hr (06/16/23 2000)   heparin 1,700 Units/hr (06/16/23 1942)   PRN: acetaminophen, diphenhydrAMINE, ondansetron (ZOFRAN) IV  Assessment: 81 yof with a history of HTN. Patient is presenting with dyspnea. Heparin per pharmacy consult placed for atrial fibrillation. Patient was not on anticoagulation prior to arrival. Initiating anticoagulation with heparin prior to possible cardioversion by cardiology.   Heparin level tonight came back therapeutic at 0.33, on 1700 units/hr. No s/sx of bleeding or infusion issues noted.   Goal of Therapy:  Heparin level 0.3-0.7 units/ml Monitor platelets by anticoagulation protocol: Yes   Plan:  Continue heparin infusion to 1700 units/hr Check anti-Xa level in 8 hours and daily while on heparin Continue to monitor H&H and platelets F/u on plans to transition to PO [facility]  Thank you for allowing pharmacy to participate in this patient's care,  [name], PharmD. Clinical Pharmacist 06/17/2023 5:26 AM

## 2023-06-17 NOTE — TOC Benefit Eligibility Note (Signed)
Pharmacy Patient Advocate Encounter  Insurance verification completed.    The patient is insured through [facility] Medicare Part D  Ran test claim for Eliquis 5 mg and the current 30 day co-pay is $0.00.  Ran test claim for Xarelto 20 mg and the current 30 day co-pay is $0.00.  Ran test claim for Farxiga 10 mg and the current 30 day co-pay is $0.00.  Ran test claim for Jardiance 10 mg and the current 30 day co-pay is $0.00.   This test claim was processed through [facility]- copay amounts may vary at other pharmacies due to pharmacy/plan contracts, or as the patient moves through the different stages of their insurance plan.    Roland Earl, CPHT Pharmacy Patient Advocate Specialist [facility] Patient Advocate Team Direct Number: [phone]  Fax: [phone]

## 2023-06-17 NOTE — Progress Notes (Signed)
PROGRESS NOTE  Bailey Solomon:[id number] DOB: 1941-04-30   PCP: Kaleen Mask, MD  Patient is from: Home. Lives with son.  Uses rollator at baseline  DOA: 06/15/2023 LOS: 0  Chief complaints Chief Complaint  Patient presents with   Tachycardia     Brief Narrative / Interim history: 82 year old F with PMH of HTN, HLD, prior smoking and morbid obesity presenting with palpitation and shortness of breath for few days and admitted with new onset A-fib with RVR and possible CHF.  Patient was in RVR to 138 on arrival.  CXR showed pulmonary vascular congestion.  BNP was elevated to 207.  Started on Cardizem drip, IV heparin and IV Lasix.  Heart rate improved.  Echocardiogram ordered.  Admitted for further care.  TTE with LVEF of 45 to 50%, indeterminate DD and moderate LAE.  Heart rate improved and transition to p.o. Toprol-XL and Eliquis.  However, heart rate up to 120s again.  Cardiology consulted.   Subjective: Seen and examined earlier this morning.  No major events overnight of this morning.  Complaining lack of sleep in the hospital.  She wants to go home.  Threatening to leave AMA.  Daughter over the phone encouraging her to stay until she is  stable.  Heart rate was in 80s earlier this morning but increased to 120s.  She is not symptomatic from this.  Objective: Vitals:   06/17/23 0000 06/17/23 0400 06/17/23 0447 06/17/23 0800  BP:   139/61 127/85  Pulse:   84 (!) 126  Resp:   18   Temp:   98.2 F (36.8 C) 98.5 F (36.9 C)  TempSrc:   Oral Oral  SpO2: 97% 98% 98% 95%  Weight:      Height:        Examination:  GENERAL: No apparent distress.  Nontoxic. HEENT: MMM.  Vision and hearing grossly intact.  NECK: Supple.  No apparent JVD.  RESP:  No IWOB.  Fair aeration bilaterally. CVS: Irregular rhythm.  HR range from 100s - 130s.Marland Kitchen  Heart sounds normal.  ABD/GI/GU: BS+. Abd soft, NTND.  MSK/EXT:  Moves extremities. No apparent deformity. No edema.  SKIN: no  apparent skin lesion or wound NEURO: Awake, alert and oriented appropriately.  No apparent focal neuro deficit. PSYCH: Calm. Normal affect.   Procedures:  None  Microbiology summarized: None  Assessment and plan: Principal Problem:   Atrial fibrillation with RVR (HCC) Active Problems:   New onset of congestive heart failure (HCC)   HTN (hypertension)  New onset atrial fibrillation with RVR: Presented with RVR to 130s.  Started on Cardizem drip.  TTE with LVEF of 45 to 50%.  TSH normal.  Denies drinking alcohol or too much caffeine.  HR improved to 80s and 90s and transition to Toprol-XL and Eliquis.  Now HR up to 120s.  CHA2DS2-VASc score 5. -Toprol-XL 50 mg daily -Eliquis 5 mg twice daily -Optimize electrolytes -Cardiology consult -PT/OT eval  Acute heart failure with mildly reduced EF: Presented with SOB.  X-ray suggested vascular congestion.  BNP 207 but could be falsely low due to morbid obesity.  Had peripheral edema on presentation that seems to have resolved.  TTE as above.  Received IV Lasix in ED. excellent urine output without Lasix now.  Appears euvolemic on exam. -Cardiology consulted.  Defer further diuretics to cardiology. -Toprol-XL as above -Strict intake and output, daily weight, renal functions and electrolytes  Essential hypertension: Normotensive for most part. -On Toprol-XL 50 mg daily now  Hypokalemia -Replenish and recheck.  Mag within normal.  Morbid obesity Body mass index is 44.46 kg/m. -Encourage lifestyle change to lose weight.           DVT prophylaxis:  On full dose anticoagulation apixaban (ELIQUIS) tablet 5 mg  Code Status: DNR/DNI Family Communication: None at bedside Level of care: Telemetry Cardiac Status is: Inpatient The patient will remain inpatient because: New onset A-fib with RVR and new onset CHF   Final disposition: Likely home Consultants:  Cardiology  55 minutes with more than 50% spent in reviewing records,  counseling patient/family and coordinating care.   Sch Meds:  Scheduled Meds:  apixaban  5 mg Oral BID   metoprolol succinate  50 mg Oral Daily   Continuous Infusions:   PRN Meds:.acetaminophen, diphenhydrAMINE, metoprolol tartrate, ondansetron (ZOFRAN) IV  Antimicrobials: Anti-infectives (From admission, onward)    None        I have personally reviewed the following labs and images: CBC: Recent Labs  Lab 06/15/23 1832 06/16/23 0557 06/17/23 0426  WBC 12.3* 10.0 10.6*  HGB 12.5 12.3 12.7  HCT 39.2 38.3 40.2  MCV 86.9 86.7 88.9  PLT 264 262 238   BMP &GFR Recent Labs  Lab 06/15/23 1832 06/16/23 0557 06/17/23 0426  NA 138 137 137  K 3.7 3.2* 3.9  CL 104 102 104  CO2 25 26 24   GLUCOSE 101* 118* 144*  BUN 16 16 18   CREATININE 0.75 0.76 0.78  CALCIUM 9.5 9.0 9.1  MG 2.1 2.0 2.0  PHOS  --   --  2.4*   Estimated Creatinine Clearance: 63.2 mL/min (by C-G formula based on SCr of 0.78 mg/dL). Liver & Pancreas: Recent Labs  Lab 06/17/23 0426  ALBUMIN 3.3*   No results for input(s): "LIPASE", "AMYLASE" in the last 168 hours. No results for input(s): "AMMONIA" in the last 168 hours. Diabetic: No results for input(s): "HGBA1C" in the last 72 hours. No results for input(s): "GLUCAP" in the last 168 hours. Cardiac Enzymes: No results for input(s): "CKTOTAL", "CKMB", "CKMBINDEX", "TROPONINI" in the last 168 hours. No results for input(s): "PROBNP" in the last 8760 hours. Coagulation Profile: No results for input(s): "INR", "PROTIME" in the last 168 hours. Thyroid Function Tests: Recent Labs    06/15/23 1832  TSH 2.361   Lipid Profile: No results for input(s): "CHOL", "HDL", "LDLCALC", "TRIG", "CHOLHDL", "LDLDIRECT" in the last 72 hours. Anemia Panel: No results for input(s): "VITAMINB12", "FOLATE", "FERRITIN", "TIBC", "IRON", "RETICCTPCT" in the last 72 hours. Urine analysis:    Component Value Date/Time   COLORURINE YELLOW 06/29/2017 1305    APPEARANCEUR CLEAR 06/29/2017 1305   LABSPEC 1.003 (L) 06/29/2017 1305   PHURINE 7.0 06/29/2017 1305   GLUCOSEU NEGATIVE 06/29/2017 1305   HGBUR NEGATIVE 06/29/2017 1305   BILIRUBINUR NEGATIVE 06/29/2017 1305   KETONESUR NEGATIVE 06/29/2017 1305   PROTEINUR NEGATIVE 06/29/2017 1305   UROBILINOGEN 1.0 07/26/2014 1131   NITRITE NEGATIVE 06/29/2017 1305   LEUKOCYTESUR NEGATIVE 06/29/2017 1305   Sepsis Labs: Invalid input(s): "PROCALCITONIN", "LACTICIDVEN"  Microbiology: Recent Results (from the past 240 hour(s))  SARS Coronavirus 2 by RT PCR (hospital order, performed in [facility] hospital lab) *cepheid single result test* Anterior Nasal Swab     Status: None   Collection Time: 06/15/23  4:47 PM   Specimen: Anterior Nasal Swab  Result Value Ref Range Status   SARS Coronavirus 2 by RT PCR NEGATIVE NEGATIVE Final    Comment: Performed at [facility], [address], [place], [place] [address]    Radiology Studies: ECHOCARDIOGRAM COMPLETE  Result Date: 06/16/2023    ECHOCARDIOGRAM REPORT   Patient Name:   VIEVA BRUMMITT Date of Exam: 06/16/2023 Medical Rec #:  [id number]        Height:       61.0 in Accession #:    [id number]       Weight:       235.3 lb Date of Birth:  March 10, 1941       BSA:          2.024 m Patient Age:    81 years         BP:           112/63 mmHg Patient Gender: F                HR:           100 bpm. Exam Location:  Inpatient Procedure: 2D Echo, Cardiac Doppler and Color Doppler Indications:    A-Fib I48.91  History:        Patient has no prior history of Echocardiogram examinations.                 Arrythmias:Atrial Fibrillation; Risk Factors:Former Smoker and                 Hypertension.  Sonographer:    Dondra Prader RVT RCS Referring Phys: [phone] JARED M GARDNER  Sonographer Comments: Patient is obese. Image acquisition challenging due to patient body habitus. IMPRESSIONS  1. Left ventricular ejection fraction, by estimation, is 45 to 50%. The left ventricle has  mildly decreased function. Left ventricular diastolic parameters are indeterminate.  2. Right ventricular systolic function is normal. The right ventricular size is normal.  3. Left atrial size was moderately dilated.  4. Trivial mitral valve regurgitation.  5. The aortic valve is tricuspid. Aortic valve regurgitation is not visualized. Aortic valve sclerosis/calcification is present, without any evidence of aortic stenosis.  6. The inferior vena cava is normal in size with greater than 50% respiratory variability, suggesting right atrial pressure of 3 mmHg. FINDINGS  Left Ventricle: Left ventricular ejection fraction, by estimation, is 45 to 50%. The left ventricle has mildly decreased function. The left ventricular internal cavity size was normal in size. There is no left ventricular hypertrophy. Left ventricular diastolic parameters are indeterminate. Right Ventricle: The right ventricular size is normal. Right vetricular wall thickness was not assessed. Right ventricular systolic function is normal. Left Atrium: Left atrial size was moderately dilated. Right Atrium: Right atrial size was normal in size. Pericardium: There is no evidence of pericardial effusion. Mitral Valve: There is mild thickening of the mitral valve leaflet(s). Mild mitral annular calcification. Trivial mitral valve regurgitation. Tricuspid Valve: The tricuspid valve is normal in structure. Tricuspid valve regurgitation is trivial. Aortic Valve: The aortic valve is tricuspid. Aortic valve regurgitation is not visualized. Aortic valve sclerosis/calcification is present, without any evidence of aortic stenosis. Aortic valve mean gradient measures 5.3 mmHg. Aortic valve peak gradient measures 9.2 mmHg. Aortic valve area, by VTI measures 2.22 cm. Pulmonic Valve: The pulmonic valve was not well visualized. Pulmonic valve regurgitation is not visualized. No evidence of pulmonic stenosis. Aorta: The aortic root and ascending aorta are structurally  normal, with no evidence of dilitation. Venous: The inferior vena cava is normal in size with greater than 50% respiratory variability, suggesting right atrial pressure of 3 mmHg. IAS/Shunts: No atrial level shunt detected by color flow Doppler.  LEFT  VENTRICLE PLAX 2D LVIDd:         3.95 cm LVIDs:         3.00 cm LV PW:         1.10 cm LV IVS:        1.10 cm LVOT diam:     1.90 cm LV SV:         48 LV SV Index:   24 LVOT Area:     2.84 cm  RIGHT VENTRICLE         IVC TAPSE (M-mode): 1.8 cm  IVC diam: 1.80 cm LEFT ATRIUM              Index        RIGHT ATRIUM           Index LA diam:        3.90 cm  1.93 cm/m   RA Area:     14.30 cm LA Vol (A2C):   94.9 ml  46.89 ml/m  RA Volume:   34.90 ml  17.24 ml/m LA Vol (A4C):   96.0 ml  47.43 ml/m LA Biplane Vol: 102.0 ml 50.40 ml/m  AORTIC VALVE                     PULMONIC VALVE AV Area (Vmax):    1.90 cm      PV Vmax:       0.74 m/s AV Area (Vmean):   1.77 cm      PV Peak grad:  2.2 mmHg AV Area (VTI):     2.22 cm AV Vmax:           152.00 cm/s AV Vmean:          107.000 cm/s AV VTI:            0.217 m AV Peak Grad:      9.2 mmHg AV Mean Grad:      5.3 mmHg LVOT Vmax:         101.73 cm/s LVOT Vmean:        66.833 cm/s LVOT VTI:          0.170 m LVOT/AV VTI ratio: 0.78  AORTA Ao Root diam: 2.80 cm Ao Asc diam:  3.20 cm MITRAL VALVE MV Area (PHT): 4.17 cm     SHUNTS MV Decel Time: 182 msec     Systemic VTI:  0.17 m MV E velocity: 126.00 cm/s  Systemic Diam: 1.90 cm Dietrich Pates MD Electronically signed by Dietrich Pates MD Signature Date/Time: 06/16/2023/4:32:56 PM    Final       Hanny Elsberry T. [facility]  If 7PM-7AM, please contact night-coverage [email or web address] 06/17/2023, 3:03 PM

## 2023-06-17 NOTE — Evaluation (Addendum)
Physical Therapy Evaluation Patient Details Name: Bailey Solomon MRN: [id number] DOB: 14-Nov-1941 Today's Date: 06/17/2023  History of Present Illness  82 year old F presenting with palpitation and shortness of breath and admitted with new onset A-fib with RVR and possible CHF. PMH of HTN, HLD, prior smoking and morbid obesity  Clinical Impression  Pt admitted with above diagnosis. Pt was able to ambulate with min guard assist and definite need for Assistive device. Pt aware that PT recommends using device for safety and states that she will do so even though she didn't want to use cane today.  Pt without overt LOB without device however widens BOS and is at risk of falls without device. HR 90-138 bpm with activity.  O2 on RA >90%.  Has equipment at home she can use. Will benefit from HHPT.   Pt currently with functional limitations due to the deficits listed below (see PT Problem List). Pt will benefit from acute skilled PT to increase their independence and safety with mobility to allow discharge.           Assistance Recommended at Discharge Intermittent Supervision/Assistance  If plan is discharge home, recommend the following:  Can travel by private vehicle  Assistance with cooking/housework;Assist for transportation;Help with stairs or ramp for entrance        Equipment Recommendations None recommended by PT  Recommendations for Other Services       Functional Status Assessment Patient has had a recent decline in their functional status and demonstrates the ability to make significant improvements in function in a reasonable and predictable amount of time.     Precautions / Restrictions Precautions Precautions: Fall Precaution Comments: watch HR Restrictions Weight Bearing Restrictions: No      Mobility  Bed Mobility Overal bed mobility: Needs Assistance Bed Mobility: Supine to Sit     Supine to sit: Supervision     General bed mobility comments: incr time with no  assist given    Transfers Overall transfer level: Needs assistance   Transfers: Sit to/from Stand Sit to Stand: Supervision           General transfer comment: No assist needed    Ambulation/Gait Ambulation/Gait assistance: Min guard Gait Distance (Feet): 110 Feet Assistive device: 1 person hand held assist, None Gait Pattern/deviations: Decreased step length - right, Decreased step length - left, Wide base of support, Drifts right/left       General Gait Details: Pt doesnt lose balance but definitely needs at least 1 UE support at all times. Pt reports she prefers to use cane at home however declines to use during rx.  Pt reaching for PT at times vs rail in hallway, never overtly losing balance but appears to need support for stability. Encouraged to use device at all times at home.  Stairs            Wheelchair Mobility     Tilt Bed    Modified Rankin (Stroke Patients Only)       Balance Overall balance assessment: History of Falls, Mild deficits observed, not formally tested                                           Pertinent Vitals/Pain      Home Living Family/patient expects to be discharged to:: Private residence Living Arrangements: Children Available Help at Discharge: Available PRN/intermittently;Personal care attendant Type of Home: House  Home Access: Stairs to enter Entrance Stairs-Rails: None Entrance Stairs-Number of Steps: 2 Alternate Level Stairs-Number of Steps: flight Home Layout: Two level;Able to live on main level with bedroom/bathroom Home Equipment: Rolling Walker (2 wheels);Rollator (4 wheels);Cane - single point;Shower seat;Hand held shower head      Prior Function Prior Level of Function : Independent/Modified Independent;History of Falls (last six months)             Mobility Comments: slid off of her rollator in the hospital lobby this admission, used cane or no device per pt ADLs Comments: uses her  reacher oftin; has a HHA 2x/month for cleaning only     Hand Dominance   Dominant Hand: Right    Extremity/Trunk Assessment   Upper Extremity Assessment Upper Extremity Assessment: Defer to OT evaluation    Lower Extremity Assessment Lower Extremity Assessment: Generalized weakness    Cervical / Trunk Assessment Cervical / Trunk Assessment: Other exceptions (back pain  - chronic; increased body habitus)  Communication   Communication: No difficulties  Cognition Arousal/Alertness: Awake/alert Behavior During Therapy: WFL for tasks assessed/performed Overall Cognitive Status: Within Functional Limits for tasks assessed                                          General Comments General comments (skin integrity, edema, etc.): HR 90-138 bpm with activity    Exercises     Assessment/Plan    PT Assessment Patient needs continued PT services  PT Problem List Decreased activity tolerance;Decreased balance;Decreased mobility;Decreased knowledge of use of DME;Decreased safety awareness;Decreased knowledge of precautions;Cardiopulmonary status limiting activity       PT Treatment Interventions DME instruction;Stair training;Gait training;Functional mobility training;Therapeutic activities;Therapeutic exercise;Balance training;Patient/family education    PT Goals (Current goals can be found in the Care Plan section)  Acute Rehab PT Goals Patient Stated Goal: to go home PT Goal Formulation: With patient Time For Goal Achievement: 07/01/23 Potential to Achieve Goals: Good    Frequency Min 1X/week     Co-evaluation               AM-PAC PT "6 Clicks" Mobility  Outcome Measure Help needed turning from your back to your side while in a flat bed without using bedrails?: None Help needed moving from lying on your back to sitting on the side of a flat bed without using bedrails?: None Help needed moving to and from a bed to a chair (including a wheelchair)?:  A Little Help needed standing up from a chair using your arms (e.g., wheelchair or bedside chair)?: A Little Help needed to walk in hospital room?: A Little Help needed climbing 3-5 steps with a railing? : A Lot 6 Click Score: 19    End of Session Equipment Utilized During Treatment: Gait belt Activity Tolerance: Patient limited by fatigue Patient left: in bed;with call bell/phone within reach Nurse Communication: Mobility status PT Visit Diagnosis: History of falling (Z91.81);Muscle weakness (generalized) (M62.81)    Time: 1308-6578 PT Time Calculation (min) (ACUTE ONLY): 17 min   Charges:   PT Evaluation $PT Eval Moderate Complexity: 1 Mod   PT General Charges $$ ACUTE PT VISIT: 1 Visit         Kyrstan Gotwalt M,PT Acute Rehab Services [phone]   Bevelyn Buckles 06/17/2023, 1:29 PM

## 2023-06-17 NOTE — Consult Note (Signed)
Cardiology Consultation   Patient ID: Bailey Solomon MRN: [id number]; DOB: 10/01/1941  Admit date: 06/15/2023 Date of Consult: 06/17/2023  PCP:  Kaleen Mask, MD   [facility] Providers Cardiologist:  None        Patient Profile:   Bailey Solomon is a 82 y.o. female with a hx of hypertension, hyperlipidemia, prior smoking, morbid obesity who is being seen 06/17/2023 for the evaluation of new onset afib and CHF at the request of Dr. Alanda Slim.  History of Present Illness:   Ms. Bailey Solomon presented to the ED on 06/15/23 with symptoms of palpitations, shortness of breath with even normal activity. She check her blood pressure at home and found that her HR was in the 130s. Patient without palpitations or rapid HR symptoms prior to this date though does report that an ECG at her PCP on 7/16 might have shown an "irregular heart rhythm." Has not had any chest pain or orthopnea at all. No recent weight gain or swelling in lower extremities. Patient denies significant personal cardiac history but does report a family history of CAD. She reports that she does snore at night but attributes this to allergies and has never been told she stops breathing.  In the ED, she was found to be in afib with RVR, rates into the 130s. A chest x-ray was concerning for pulmonary vascular congestion. Labs notable for BNP 207. WBC elevated at 12.3. Given RVR, she was placed on Diltiazem with modest improvement in HR. She also received IV lasix 40mg  for CHF symptoms. She was also placed on a heparin infusion for stroke prophylaxis, switched to Eliquis today.   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 Authorizing Provider  amLODipine (NORVASC) 10 MG tablet Take 10 mg by mouth daily. 06/25/17  Yes [provider]  ARNICA EX Apply 1 application topically as needed (Arthritis Pain in fingers).   Yes [provider]  diphenhydrAMINE (BENADRYL) 25 mg capsule Take 25 mg by mouth at bedtime as needed for allergies.   Yes [provider]  Lactobacillus (ACIDOPHILUS PO) Take 1 tablet by mouth daily.   Yes [provider]  Pseudoephedrine HCl (SUDAFED CONGESTION PO) Take 1 tablet by mouth daily as needed (for congestion).   Yes [provider]    Inpatient Medications: Scheduled Meds:  apixaban  5 mg Oral BID   metoprolol succinate  50 mg Oral BID   Continuous Infusions:  PRN Meds: acetaminophen, diphenhydrAMINE, metoprolol tartrate, ondansetron (ZOFRAN) IV  Allergies:    Allergies  Allergen Reactions   Acetaminophen     Constipation, prefers not to take due to this   Codeine Nausea And Vomiting  Gelatin Other (See Comments)    Gel/capsules - unable to swallow them, "get stuck in throat"   Nsaids Other (See Comments)    Constipation    Social History:   Social History   Socioeconomic History   Marital status: Divorced    Spouse name: Not on file   Number of children: Not on file   Years of education: Not on file   Highest education level: Not on file  Occupational History   Not on file  Tobacco Use   Smoking status: Former    Current packs/day: 3.00    Average packs/day: 3.0 packs/day for 19.0 years (57.0 ttl pk-yrs)    Types: Cigarettes   Smokeless tobacco: Never   Tobacco  comments:    "quit smoking in fall 1975"  Substance and Sexual Activity   Alcohol use: Yes    Comment: 08/03/2014 "glass of wine a couple times/month"   Drug use: No   Sexual activity: Never  Other Topics Concern   Not on file  Social History Narrative   Not on file   Social Determinants of Health   Financial Resource Strain: Not on file  Food Insecurity: No Food Insecurity (06/16/2023)   Hunger Vital Sign    Worried About Running Out of Food in the Last Year: Never true    Ran Out of Food in the Last Year: Never true  Transportation Needs: No Transportation Needs (06/16/2023)   PRAPARE - Administrator, Civil Service (Medical): No    Lack of Transportation (Non-Medical): No  Physical Activity: Not on file  Stress: Not on file  Social Connections: Unknown (04/09/2022)   Received from [facility]   Social Network    Social Network: Not on file  Intimate Partner Violence: Not At Risk (06/16/2023)   Humiliation, Afraid, Rape, and Kick questionnaire    Fear of Current or Ex-Partner: No    Emotionally Abused: No    Physically Abused: No    Sexually Abused: No    Family History:    Family History  Problem Relation Age of Onset   Asthma Other    Cancer Other    Hypertension Other    Heart attack Other      ROS:  Please see the history of present illness.   All other ROS reviewed and negative.     Physical Exam/Data:   Vitals:   06/17/23 0000 06/17/23 0400 06/17/23 0447 06/17/23 0800  BP:   139/61 127/85  Pulse:   84 (!) 126  Resp:   18   Temp:   98.2 F (36.8 C) 98.5 F (36.9 C)  TempSrc:   Oral Oral  SpO2: 97% 98% 98% 95%  Weight:      Height:        Intake/Output Summary (Last 24 hours) at 06/17/2023 1508 Last data filed at 06/17/2023 1013 Gross per 24 hour  Intake 414 ml  Output 850 ml  Net -436 ml      06/15/2023   11:37 PM 03/01/2022    8:35 AM 08/03/2014   11:12 AM  Last 3 Weights  Weight (lbs) 235 lb 4.8 oz 240 lb 260 lb  Weight (kg)  106.731 kg 108.863 kg 117.935 kg     Body mass index is 44.46 kg/m.  General:  Well nourished, well developed, in no acute distress HEENT: normal Neck: no JVD Vascular: No carotid bruits; Distal pulses 2+ bilaterally Cardiac:  normal S1, S2; irregularly irregular, no murmur Lungs:  clear  to auscultation bilaterally, no wheezing, rhonchi or rales  Abd: soft, nontender, no hepatomegaly  Ext: no edema Musculoskeletal:  No deformities, BUE and BLE strength normal and equal Skin: warm and dry  Neuro:  CNs 2-12 intact, no focal abnormalities noted Psych:  Normal affect   EKG:  The EKG was personally reviewed and demonstrates:  atrial fibrillation with RVR into the 140s Telemetry:  Telemetry was personally reviewed and demonstrates:  afib with ventricular rates in the low 100s to 120s.  Relevant CV Studies:  IMPRESSIONS     1. Left ventricular ejection fraction, by estimation, is 45 to 50%. The  left ventricle has mildly decreased function. Left ventricular diastolic  parameters are indeterminate.   2. Right ventricular systolic function is normal. The right ventricular  size is normal.   3. Left atrial size was moderately dilated.   4. Trivial mitral valve regurgitation.   5. The aortic valve is tricuspid. Aortic valve regurgitation is not  visualized. Aortic valve sclerosis/calcification is present, without any  evidence of aortic stenosis.   6. The inferior vena cava is normal in size with greater than 50%  respiratory variability, suggesting right atrial pressure of 3 mmHg.   FINDINGS   Left Ventricle: Left ventricular ejection fraction, by estimation, is 45  to 50%. The left ventricle has mildly decreased function. The left  ventricular internal cavity size was normal in size. There is no left  ventricular hypertrophy. Left ventricular  diastolic parameters are indeterminate.   Right Ventricle: The right ventricular size is normal. Right vetricular  wall thickness was not  assessed. Right ventricular systolic function is  normal.   Left Atrium: Left atrial size was moderately dilated.   Right Atrium: Right atrial size was normal in size.   Pericardium: There is no evidence of pericardial effusion.   Mitral Valve: There is mild thickening of the mitral valve leaflet(s).  Mild mitral annular calcification. Trivial mitral valve regurgitation.   Tricuspid Valve: The tricuspid valve is normal in structure. Tricuspid  valve regurgitation is trivial.   Aortic Valve: The aortic valve is tricuspid. Aortic valve regurgitation is  not visualized. Aortic valve sclerosis/calcification is present, without  any evidence of aortic stenosis. Aortic valve mean gradient measures 5.3  mmHg. Aortic valve peak gradient  measures 9.2 mmHg. Aortic valve area, by VTI measures 2.22 cm.   Pulmonic Valve: The pulmonic valve was not well visualized. Pulmonic valve  regurgitation is not visualized. No evidence of pulmonic stenosis.   Aorta: The aortic root and ascending aorta are structurally normal, with  no evidence of dilitation.   Venous: The inferior vena cava is normal in size with greater than 50%  respiratory variability, suggesting right atrial pressure of 3 mmHg.   IAS/Shunts: No atrial level shunt detected by color flow Doppler.   Laboratory Data:  High Sensitivity Troponin:  No results for input(s): "TROPONINIHS" in the last 720 hours.   Chemistry Recent Labs  Lab 06/15/23 1832 06/16/23 0557 06/17/23 0426  NA 138 137 137  K 3.7 3.2* 3.9  CL 104 102 104  CO2 25 26 24   GLUCOSE 101* 118* 144*  BUN 16 16 18   CREATININE 0.75 0.76 0.78  CALCIUM 9.5 9.0 9.1  MG 2.1 2.0 2.0  GFRNONAA >60 >60 >60  ANIONGAP 9 9 9     Recent Labs  Lab 06/17/23 0426  ALBUMIN 3.3*   Lipids No results for input(s): "CHOL", "TRIG", "HDL", "LABVLDL", "LDLCALC", "CHOLHDL" in the last 168 hours.  Hematology  Recent Labs  Lab 06/15/23 1832 06/16/23 0557 06/17/23 0426  WBC  12.3* 10.0 10.6*  RBC 4.51 4.42 4.52  HGB 12.5 12.3 12.7  HCT 39.2 38.3 40.2  MCV 86.9 86.7 88.9  MCH 27.7 27.8 28.1  MCHC 31.9 32.1 31.6  RDW 13.6 13.7 13.8  PLT 264 262 238   Thyroid  Recent Labs  Lab 06/15/23 1832  TSH 2.361    BNP Recent Labs  Lab 06/15/23 1832  BNP 207.3*    DDimer No results for input(s): "DDIMER" in the last 168 hours.   Radiology/Studies:  ECHOCARDIOGRAM COMPLETE  Result Date: 06/16/2023    ECHOCARDIOGRAM REPORT   Patient Name:   MAHI ZABRISKIE Date of Exam: 06/16/2023 Medical Rec #:  [id number]        Height:       61.0 in Accession #:    [id number]       Weight:       235.3 lb Date of Birth:  [birth year]       BSA:          2.024 m Patient Age:    81 years         BP:           112/63 mmHg Patient Gender: F                HR:           100 bpm. Exam Location:  Inpatient Procedure: 2D Echo, Cardiac Doppler and Color Doppler Indications:    A-Fib I48.91  History:        Patient has no prior history of Echocardiogram examinations.                 Arrythmias:Atrial Fibrillation; Risk Factors:Former Smoker and                 Hypertension.  Sonographer:    Dondra Prader RVT RCS Referring Phys: [phone] JARED M GARDNER  Sonographer Comments: Patient is obese. Image acquisition challenging due to patient body habitus. IMPRESSIONS  1. Left ventricular ejection fraction, by estimation, is 45 to 50%. The left ventricle has mildly decreased function. Left ventricular diastolic parameters are indeterminate.  2. Right ventricular systolic function is normal. The right ventricular size is normal.  3. Left atrial size was moderately dilated.  4. Trivial mitral valve regurgitation.  5. The aortic valve is tricuspid. Aortic valve regurgitation is not visualized. Aortic valve sclerosis/calcification is present, without any evidence of aortic stenosis.  6. The inferior vena cava is normal in size with greater than 50% respiratory variability, suggesting right atrial pressure of 3 mmHg.  FINDINGS  Left Ventricle: Left ventricular ejection fraction, by estimation, is 45 to 50%. The left ventricle has mildly decreased function. The left ventricular internal cavity size was normal in size. There is no left ventricular hypertrophy. Left ventricular diastolic parameters are indeterminate. Right Ventricle: The right ventricular size is normal. Right vetricular wall thickness was not assessed. Right ventricular systolic function is normal. Left Atrium: Left atrial size was moderately dilated. Right Atrium: Right atrial size was normal in size. Pericardium: There is no evidence of pericardial effusion. Mitral Valve: There is mild thickening of the mitral valve leaflet(s). Mild mitral annular calcification. Trivial mitral valve regurgitation. Tricuspid Valve: The tricuspid valve is normal in structure. Tricuspid valve regurgitation is trivial. Aortic Valve: The aortic valve is tricuspid. Aortic valve regurgitation is not visualized. Aortic valve sclerosis/calcification is present, without any evidence of aortic stenosis. Aortic  valve mean gradient measures 5.3 mmHg. Aortic valve peak gradient measures 9.2 mmHg. Aortic valve area, by VTI measures 2.22 cm. Pulmonic Valve: The pulmonic valve was not well visualized. Pulmonic valve regurgitation is not visualized. No evidence of pulmonic stenosis. Aorta: The aortic root and ascending aorta are structurally normal, with no evidence of dilitation. Venous: The inferior vena cava is normal in size with greater than 50% respiratory variability, suggesting right atrial pressure of 3 mmHg. IAS/Shunts: No atrial level shunt detected by color flow Doppler.  LEFT VENTRICLE PLAX 2D LVIDd:         3.95 cm LVIDs:         3.00 cm LV PW:         1.10 cm LV IVS:        1.10 cm LVOT diam:     1.90 cm LV SV:         48 LV SV Index:   24 LVOT Area:     2.84 cm  RIGHT VENTRICLE         IVC TAPSE (M-mode): 1.8 cm  IVC diam: 1.80 cm LEFT ATRIUM              Index        RIGHT  ATRIUM           Index LA diam:        3.90 cm  1.93 cm/m   RA Area:     14.30 cm LA Vol (A2C):   94.9 ml  46.89 ml/m  RA Volume:   34.90 ml  17.24 ml/m LA Vol (A4C):   96.0 ml  47.43 ml/m LA Biplane Vol: 102.0 ml 50.40 ml/m  AORTIC VALVE                     PULMONIC VALVE AV Area (Vmax):    1.90 cm      PV Vmax:       0.74 m/s AV Area (Vmean):   1.77 cm      PV Peak grad:  2.2 mmHg AV Area (VTI):     2.22 cm AV Vmax:           152.00 cm/s AV Vmean:          107.000 cm/s AV VTI:            0.217 m AV Peak Grad:      9.2 mmHg AV Mean Grad:      5.3 mmHg LVOT Vmax:         101.73 cm/s LVOT Vmean:        66.833 cm/s LVOT VTI:          0.170 m LVOT/AV VTI ratio: 0.78  AORTA Ao Root diam: 2.80 cm Ao Asc diam:  3.20 cm MITRAL VALVE MV Area (PHT): 4.17 cm     SHUNTS MV Decel Time: 182 msec     Systemic VTI:  0.17 m MV E velocity: 126.00 cm/s  Systemic Diam: 1.90 cm Dietrich Pates MD Electronically signed by Dietrich Pates MD Signature Date/Time: 06/16/2023/4:32:56 PM    Final    DG Chest Port 1 View  Result Date: 06/15/2023 CLINICAL DATA:  Shortness of breath EXAM: PORTABLE CHEST 1 VIEW COMPARISON:  Chest x-ray 01/17/2017 FINDINGS: Heart is enlarged. There central pulmonary vascular congestion. Right costophrenic angle has been excluded. There is no large pleural effusion, focal lung infiltrate or pneumothorax. No acute fractures are seen. IMPRESSION: Cardiomegaly with central pulmonary vascular congestion. Electronically  Signed   By: Darliss Cheney M.D.   On: 06/15/2023 17:28     Assessment and Plan:   Acute/new onset afib with RVR  Patient admitted with new onset afib with RVR, symptoms beginning on 06/15/23. She was initially treated with diltiazem and then also received one time dose of digoxin on 06/16/23. Now on metoprolol succinate 25mg  with moderate rate control but complete resolution of palpitation symptoms.  Will give a second dose of Metoprolol Succinate 50mg  this evening. If well tolerated, switch  to Metoprolol Succinate 100mg  tomorrow morning.  Given lack of symptoms on beta blocker, if adequate rate control achieved, would be reasonable to plan for outpatient DCCV following 3 weeks of uninterrupted OAC but could also consider inpatient TEE/DCCV. Will discuss with Dr. Mayford Knife.  Continue Eliquis 5mg  BID Patient certainly at risk of OSA with elevated BMI, would suggest outpatient sleep study.  Acute HFmrEF  Patient admitted with new afib, also found to have moderately reduced LVEF at 45-50%. Noted with pulmonary vascular congestion and elevated BNP (207.3) this admission as well). Class III symptoms in the last few days.   Suspect that patient's LVEF reduction is secondary to new onset afib with RVR. Will plan to rate control patient and have her seen in afib clinic as above.  Appears euvolemic on physical exam today. Per HF [facility] clinical pharmacy team, Sherryll Burger, Jardiance/Farxiga both $0. Start Jardiance today and consider MRA/ARB vs Entresto tomorrow if BP permits.   Risk Assessment/Risk Scores:        New York Heart Association (NYHA) Functional Class NYHA Class III  CHA2DS2-VASc Score = 5   This indicates a 7.2% annual risk of stroke. The patient's score is based upon: CHF History: 1 HTN History: 1 Diabetes History: 0 Stroke History: 0 Vascular Disease History: 0 Age Score: 2 Gender Score: 1         For questions or updates, please contact [facility] Please consult [email or web address] for contact info under    Signed, Perlie Gold, PA-C  06/17/2023 3:08 PM

## 2023-06-18 DIAGNOSIS — I420 Dilated cardiomyopathy: Secondary | ICD-10-CM | POA: Diagnosis not present

## 2023-06-18 DIAGNOSIS — I4891 Unspecified atrial fibrillation: Secondary | ICD-10-CM | POA: Diagnosis not present

## 2023-06-18 DIAGNOSIS — E876 Hypokalemia: Secondary | ICD-10-CM | POA: Diagnosis not present

## 2023-06-18 DIAGNOSIS — I10 Essential (primary) hypertension: Secondary | ICD-10-CM | POA: Diagnosis not present

## 2023-06-18 DIAGNOSIS — I509 Heart failure, unspecified: Secondary | ICD-10-CM | POA: Diagnosis not present

## 2023-06-18 LAB — RENAL FUNCTION PANEL
Albumin: 3 g/dL — ABNORMAL LOW (ref 3.5–5.0)
Anion gap: 8 (ref 5–15)
BUN: 22 mg/dL (ref 8–23)
CO2: 24 mmol/L (ref 22–32)
Calcium: 9.1 mg/dL (ref 8.9–10.3)
Chloride: 102 mmol/L (ref 98–111)
Creatinine, Ser: 0.7 mg/dL (ref 0.44–1.00)
GFR, Estimated: >60 mL/min (ref >60)
Glucose, Bld: 135 mg/dL — ABNORMAL HIGH (ref 70–99)
Phosphorus: 4 mg/dL (ref 2.5–4.6)
Potassium: 3.9 mmol/L (ref 3.5–5.1)
Sodium: 134 mmol/L — ABNORMAL LOW (ref 135–145)

## 2023-06-18 LAB — MAGNESIUM: Magnesium: 2 mg/dL (ref 1.7–2.4)

## 2023-06-18 LAB — CBC
HCT: 36.5 % (ref 36.0–46.0)
Hemoglobin: 11.7 g/dL — ABNORMAL LOW (ref 12.0–15.0)
MCH: 27.6 pg (ref 26.0–34.0)
MCHC: 32.1 g/dL (ref 30.0–36.0)
MCV: 86.1 fL (ref 80.0–100.0)
Platelets: 251 10*3/uL (ref 150–400)
RBC: 4.24 MIL/uL (ref 3.87–5.11)
RDW: 13.8 % (ref 11.5–15.5)
WBC: 8.5 10*3/uL (ref 4.0–10.5)
nRBC: 0 % (ref 0.0–0.2)

## 2023-06-18 MED ORDER — LORAZEPAM 0.5 MG PO TABS
0.5000 mg | ORAL_TABLET | Freq: Two times a day (BID) | ORAL | Status: DC | PRN
  Filled 2023-06-18: qty 1

## 2023-06-18 MED ORDER — METOPROLOL SUCCINATE ER 100 MG PO TB24
100.0000 mg | ORAL_TABLET | Freq: Every day | ORAL | Status: DC
  Administered 2023-06-19 – 2023-06-20 (×2): 100 mg via ORAL
  Filled 2023-06-18 (×2): qty 1

## 2023-06-18 MED ORDER — METOPROLOL SUCCINATE ER 50 MG PO TB24
50.0000 mg | ORAL_TABLET | Freq: Once | ORAL | Status: AC
  Administered 2023-06-18: 50 mg via ORAL
  Filled 2023-06-18: qty 1

## 2023-06-18 MED ORDER — METOPROLOL SUCCINATE ER 100 MG PO TB24: 100.0000 mg | ORAL_TABLET | Freq: Every day | ORAL | Status: DC

## 2023-06-18 MED ORDER — SACUBITRIL-VALSARTAN 24-26 MG PO TABS
1.0000 | ORAL_TABLET | Freq: Two times a day (BID) | ORAL | Status: DC
  Administered 2023-06-18 – 2023-06-19 (×3): 1 via ORAL
  Filled 2023-06-18 (×3): qty 1

## 2023-06-18 NOTE — Plan of Care (Signed)
  Problem: Health Behavior/Discharge Planning: Goal: Ability to manage health-related needs will improve Outcome: Progressing   Problem: Clinical Measurements: Goal: Ability to maintain clinical measurements within normal limits will improve Outcome: Progressing   Problem: Pain Managment: Goal: General experience of comfort will improve Outcome: Progressing   Problem: Nutrition: Goal: Adequate nutrition will be maintained Outcome: Completed/Met

## 2023-06-18 NOTE — Progress Notes (Signed)
PROGRESS NOTE  Bailey Solomon GMW:[id number] DOB: March 13, 1941   PCP: Kaleen Mask, MD  Patient is from: Home. Lives with son.  Uses rollator at baseline  DOA: 06/15/2023 LOS: 1  Chief complaints Chief Complaint  Patient presents with   Tachycardia     Brief Narrative / Interim history: 82 year old F with PMH of HTN, HLD, prior smoking and morbid obesity presenting with palpitation and shortness of breath for few days and admitted with new onset A-fib with RVR and possible CHF.  Patient was in RVR to 138 on arrival.  CXR showed pulmonary vascular congestion.  BNP was elevated to 207.  Started on Cardizem drip, IV heparin and IV Lasix.  Heart rate improved.  Echocardiogram ordered.  Admitted for further care.  TTE with LVEF of 45 to 50%, indeterminate DD and moderate LAE.  Heart rate improved and transition to p.o. Toprol-XL and Eliquis.  However, heart rate up to 120s again.  Cardiology consulted, increased Toprol-XL.  Possible TEE/DCCV if remains in RVR.   Subjective: Seen and examined earlier this morning.  No major events overnight of this morning.  HR ranges from 80s to 100s.  Does not seem to be symptomatic from this.  She feels anxious.  She states she used Valium in the past, and asking if she can get something for anxiety.   Objective: Vitals:   06/17/23 0800 06/17/23 2150 06/18/23 0247 06/18/23 0328  BP: 127/85 (!) 142/78  (!) 139/99  Pulse: (!) 126  97 97  Resp:  18 18 16   Temp: 98.5 F (36.9 C) 98.4 F (36.9 C) 97.7 F (36.5 C) 97.8 F (36.6 C)  TempSrc: Oral Oral Oral Oral  SpO2: 95% 95%    Weight:      Height:        Examination:  GENERAL: No apparent distress.  Nontoxic. HEENT: MMM.  Vision and hearing grossly intact.  NECK: Supple.  No apparent JVD.  RESP:  No IWOB.  Fair aeration bilaterally. CVS: Irregular rhythm.  HR ranges from 80s to 100s.Marland Kitchen  Heart sounds normal.  ABD/GI/GU: BS+. Abd soft, NTND.  MSK/EXT:  Moves extremities. No apparent  deformity. No edema.  SKIN: no apparent skin lesion or wound NEURO: Awake, alert and oriented appropriately.  No apparent focal neuro deficit. PSYCH: Calm. Normal affect.   Procedures:  None  Microbiology summarized: None  Assessment and plan: Principal Problem:   Atrial fibrillation with RVR (HCC) Active Problems:   New onset of congestive heart failure (HCC)   HTN (hypertension)  New onset atrial fibrillation with RVR: Presented with RVR to 130s.  Started on Cardizem drip.  TSH normal.  Denies drinking alcohol or too much caffeine.  HR improved, ranges from 80's to 100's. CHA2DS2-VASc score 5. -Cardiology increased Toprol-XL to 100 mg daily -Eliquis 5 mg twice daily -Possible TEE/DCCV on 7/25 if HR remains over 100 -Optimize electrolytes -Advised to avoid over-the-counter nasal decongestant. -PT/OT eval  Acute heart failure with mildly reduced EF: Presented with SOB.  X-ray suggested vascular congestion.  BNP 207 but could be falsely low due to morbid obesity. TTE with LVEF of 45 to 50% but in the setting of RVR. Had peripheral edema on presentation that seems to have resolved.  Received IV Lasix in ED. excellent urine output without Lasix now.  Appears euvolemic on exam. -Cardiology started Jardiance -Toprol-XL as above -Strict intake and output, daily weight, renal functions and electrolytes  Essential hypertension: Normotensive for most part. -On Toprol-XL 50 mg daily  now  Hypokalemia -Replenish and recheck.  Mag within normal.  Anxiety: Feels anxious.  She states she tried Valium in the past. -Lorazepam 0.5 mg twice daily as needed  Morbid obesity Body mass index is 44.46 kg/m. -Encourage lifestyle change to lose weight.           DVT prophylaxis:  apixaban (ELIQUIS) tablet 5 mg   Code Status: DNR/DNI Family Communication: Updated patient's daughter over the phone. Level of care: Telemetry Cardiac Status is: Inpatient The patient will remain inpatient  because: New onset A-fib with RVR and new onset CHF   Final disposition: Likely home Consultants:  Cardiology  35 minutes with more than 50% spent in reviewing records, counseling patient/family and coordinating care.   Sch Meds:  Scheduled Meds:  apixaban  5 mg Oral BID   empagliflozin  10 mg Oral Daily   melatonin  6 mg Oral QHS   [START ON 06/19/2023] metoprolol succinate  100 mg Oral Daily   sacubitril-valsartan  1 tablet Oral BID   Continuous Infusions:   PRN Meds:.acetaminophen, diphenhydrAMINE, LORazepam, metoprolol tartrate, ondansetron (ZOFRAN) IV  Antimicrobials: Anti-infectives (From admission, onward)    None        I have personally reviewed the following labs and images: CBC: Recent Labs  Lab 06/15/23 1832 06/16/23 0557 06/17/23 0426 06/18/23 0033  WBC 12.3* 10.0 10.6* 8.5  HGB 12.5 12.3 12.7 11.7*  HCT 39.2 38.3 40.2 36.5  MCV 86.9 86.7 88.9 86.1  PLT 264 262 238 251   BMP &GFR Recent Labs  Lab 06/15/23 1832 06/16/23 0557 06/17/23 0426 06/18/23 0033  NA 138 137 137 134*  K 3.7 3.2* 3.9 3.9  CL 104 102 104 102  CO2 25 26 24 24   GLUCOSE 101* 118* 144* 135*  BUN 16 16 18 22   CREATININE 0.75 0.76 0.78 0.70  CALCIUM 9.5 9.0 9.1 9.1  MG 2.1 2.0 2.0 2.0  PHOS  --   --  2.4* 4.0   Estimated Creatinine Clearance: 63.2 mL/min (by C-G formula based on SCr of 0.7 mg/dL). Liver & Pancreas: Recent Labs  Lab 06/17/23 0426 06/18/23 0033  ALBUMIN 3.3* 3.0*   No results for input(s): "LIPASE", "AMYLASE" in the last 168 hours. No results for input(s): "AMMONIA" in the last 168 hours. Diabetic: No results for input(s): "HGBA1C" in the last 72 hours. No results for input(s): "GLUCAP" in the last 168 hours. Cardiac Enzymes: No results for input(s): "CKTOTAL", "CKMB", "CKMBINDEX", "TROPONINI" in the last 168 hours. No results for input(s): "PROBNP" in the last 8760 hours. Coagulation Profile: No results for input(s): "INR", "PROTIME" in the last  168 hours. Thyroid Function Tests: Recent Labs    06/15/23 1832  TSH 2.361   Lipid Profile: No results for input(s): "CHOL", "HDL", "LDLCALC", "TRIG", "CHOLHDL", "LDLDIRECT" in the last 72 hours. Anemia Panel: No results for input(s): "VITAMINB12", "FOLATE", "FERRITIN", "TIBC", "IRON", "RETICCTPCT" in the last 72 hours. Urine analysis:    Component Value Date/Time   COLORURINE YELLOW 06/29/2017 1305   APPEARANCEUR CLEAR 06/29/2017 1305   LABSPEC 1.003 (L) 06/29/2017 1305   PHURINE 7.0 06/29/2017 1305   GLUCOSEU NEGATIVE 06/29/2017 1305   HGBUR NEGATIVE 06/29/2017 1305   BILIRUBINUR NEGATIVE 06/29/2017 1305   KETONESUR NEGATIVE 06/29/2017 1305   PROTEINUR NEGATIVE 06/29/2017 1305   UROBILINOGEN 1.0 07/26/2014 1131   NITRITE NEGATIVE 06/29/2017 1305   LEUKOCYTESUR NEGATIVE 06/29/2017 1305   Sepsis Labs: Invalid input(s): "PROCALCITONIN", "LACTICIDVEN"  Microbiology: Recent Results (from the past 240 hour(s))  SARS Coronavirus 2 by RT PCR (hospital order, performed in [facility] hospital lab) *cepheid single result test* Anterior Nasal Swab     Status: None   Collection Time: 06/15/23  4:47 PM   Specimen: Anterior Nasal Swab  Result Value Ref Range Status   SARS Coronavirus 2 by RT PCR NEGATIVE NEGATIVE Final    Comment: Performed at [facility], [address], [place], [place] [address]    Radiology Studies: No results found.    Lucienne Sawyers T. Jeriah Skufca [facility]  If 7PM-7AM, please contact night-coverage [email or web address] 06/18/2023, 2:31 PM

## 2023-06-18 NOTE — Progress Notes (Addendum)
   Heart Failure Stewardship Pharmacist Progress Note   PCP: Kaleen Mask, MD PCP-Cardiologist: None    HPI:  82 yo F with PMH of HTN, HLD, prior smoking, and obesity.   Presented to the ED on 7/20 with shortness of breath and palpitations. CXR with pulmonary vascular congestion. BNP 207. Found to be in afib RVR. ECHO on 7/21 showed LVEF 45-50%, RV normal, trivial MR. Plan for TEE/DCCV on 7/25.   Current HF Medications: Beta Blocker: metoprolol XL 100 mg daily ACE/ARB/ARNI: Entresto 24/26 mg BID SGLT2i: Jardiance 10 mg daily  Prior to admission HF Medications: None  Pertinent Lab Values: Serum creatinine 0.70, BUN 22, Potassium 3.9, Sodium 134, BNP 207.3, Magnesium 2.0  Vital Signs: Weight: 235 lbs (admission weight: 235 lbs) Blood pressure: 140/90s  Heart rate: 100s  I/O: net -0.7L yesterday; net -1.4L since admission  Medication Assistance / Insurance Benefits Check: Does the patient have prescription insurance?  Yes Type of insurance plan: [place] [employer]   Outpatient Pharmacy:  Prior to admission [facility] Is the patient willing to use [facility] TOC pharmacy at discharge? Yes Is the patient willing to transition their outpatient pharmacy to utilize a [facility] outpatient pharmacy?   Pending    Assessment: 1. 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 NYHA class III symptoms. - Off IV lasix. Strict I/Os and daily weights. Keep K>4 and Mg>2. - Agree with transitioning to metoprolol XL 100 mg daily - Consider starting Entresto 24/26 mg BID today - Consider MRA prior to discharge - Agree with starting Jardiance 10 mg daily - Stop pseudoephedrine at discharge   Plan: 1) Medication changes recommended at this time: - Start Entresto 24/26 mg BID - Stop pseudoephedrine at discharge  2) Patient assistance: - Farxiga/Jardiance copay $0 - Entresto copay $0  3)  Education  - To be completed prior to  discharge  Sharen Hones, PharmD, BCPS Heart Failure Stewardship Pharmacist Phone [phone]

## 2023-06-18 NOTE — Progress Notes (Addendum)
Progress Note  Patient Name: MARQUITE ATTWOOD Date of Encounter: 06/18/2023  [facility] Cardiologist: None   Patient Profile     Subjective   Remains in atrial fibrillation with better HR control.  Denies any CP, SOB or palpitations.  Feels like she has a cold with sore throat and ear ache  Inpatient Medications    Scheduled Meds:  apixaban  5 mg Oral BID   empagliflozin  10 mg Oral Daily   melatonin  6 mg Oral QHS   metoprolol succinate  50 mg Oral BID   Continuous Infusions:  PRN Meds: acetaminophen, diphenhydrAMINE, metoprolol tartrate, ondansetron (ZOFRAN) IV   Vital Signs    Vitals:   06/17/23 0800 06/17/23 2150 06/18/23 0247 06/18/23 0328  BP: 127/85 (!) 142/78  (!) 139/99  Pulse: (!) 126  97 97  Resp:  18 18 16   Temp: 98.5 F (36.9 C) 98.4 F (36.9 C) 97.7 F (36.5 C) 97.8 F (36.6 C)  TempSrc: Oral Oral Oral Oral  SpO2: 95% 95%    Weight:      Height:        Intake/Output Summary (Last 24 hours) at 06/18/2023 0939 Last data filed at 06/18/2023 0809 Gross per 24 hour  Intake 420 ml  Output --  Net 420 ml      06/15/2023   11:37 PM 03/01/2022    8:35 AM 08/03/2014   11:12 AM  Last 3 Weights  Weight (lbs) 235 lb 4.8 oz 240 lb 260 lb  Weight (kg) 106.731 kg 108.863 kg 117.935 kg      Telemetry    Atrial fibrillation with HR in the 90-100's - Personally Reviewed  ECG    No new EKG to review - Personally Reviewed  Physical Exam   GEN: No acute distress.   Neck: No JVD Cardiac: irregularly irregular and tachy, no murmurs, rubs, or gallops.  Respiratory: Clear to auscultation bilaterally. GI: Soft, nontender, non-distended  MS: No edema; No deformity. Neuro:  Nonfocal  Psych: Normal affect   Labs    High Sensitivity Troponin:  No results for input(s): "TROPONINIHS" in the last 720 hours.    Chemistry Recent Labs  Lab 06/16/23 0557 06/17/23 0426 06/18/23 0033  NA 137 137 134*  K 3.2* 3.9 3.9  CL 102 104 102  CO2 26 24 24    GLUCOSE 118* 144* 135*  BUN 16 18 22   CREATININE 0.76 0.78 0.70  CALCIUM 9.0 9.1 9.1  ALBUMIN  --  3.3* 3.0*  GFRNONAA >60 >60 >60  ANIONGAP 9 9 8      Hematology Recent Labs  Lab 06/16/23 0557 06/17/23 0426 06/18/23 0033  WBC 10.0 10.6* 8.5  RBC 4.42 4.52 4.24  HGB 12.3 12.7 11.7*  HCT 38.3 40.2 36.5  MCV 86.7 88.9 86.1  MCH 27.8 28.1 27.6  MCHC 32.1 31.6 32.1  RDW 13.7 13.8 13.8  PLT 262 238 251    BNP Recent Labs  Lab 06/15/23 1832  BNP 207.3*     DDimer No results for input(s): "DDIMER" in the last 168 hours.   CHA2DS2-VASc Score = 5   This indicates a 7.2% annual risk of stroke. The patient's score is based upon: CHF History: 1 HTN History: 1 Diabetes History: 0 Stroke History: 0 Vascular Disease History: 0 Age Score: 2 Gender Score: 1      Radiology    ECHOCARDIOGRAM COMPLETE  Result Date: 06/16/2023    ECHOCARDIOGRAM REPORT   Patient Name:   [facility]  P Zollner Date of Exam: 06/16/2023 Medical Rec #:  [id number]        Height:       61.0 in Accession #:    [id number]       Weight:       235.3 lb Date of Birth:  1940-12-07       BSA:          2.024 m Patient Age:    82 years         BP:           112/63 mmHg Patient Gender: F                HR:           100 bpm. Exam Location:  Inpatient Procedure: 2D Echo, Cardiac Doppler and Color Doppler Indications:    A-Fib I48.91  History:        Patient has no prior history of Echocardiogram examinations.                 Arrythmias:Atrial Fibrillation; Risk Factors:Former Smoker and                 Hypertension.  Sonographer:    Dondra Prader RVT RCS Referring Phys: [phone] JARED M GARDNER  Sonographer Comments: Patient is obese. Image acquisition challenging due to patient body habitus. IMPRESSIONS  1. Left ventricular ejection fraction, by estimation, is 45 to 50%. The left ventricle has mildly decreased function. Left ventricular diastolic parameters are indeterminate.  2. Right ventricular systolic function is normal.  The right ventricular size is normal.  3. Left atrial size was moderately dilated.  4. Trivial mitral valve regurgitation.  5. The aortic valve is tricuspid. Aortic valve regurgitation is not visualized. Aortic valve sclerosis/calcification is present, without any evidence of aortic stenosis.  6. The inferior vena cava is normal in size with greater than 50% respiratory variability, suggesting right atrial pressure of 3 mmHg. FINDINGS  Left Ventricle: Left ventricular ejection fraction, by estimation, is 45 to 50%. The left ventricle has mildly decreased function. The left ventricular internal cavity size was normal in size. There is no left ventricular hypertrophy. Left ventricular diastolic parameters are indeterminate. Right Ventricle: The right ventricular size is normal. Right vetricular wall thickness was not assessed. Right ventricular systolic function is normal. Left Atrium: Left atrial size was moderately dilated. Right Atrium: Right atrial size was normal in size. Pericardium: There is no evidence of pericardial effusion. Mitral Valve: There is mild thickening of the mitral valve leaflet(s). Mild mitral annular calcification. Trivial mitral valve regurgitation. Tricuspid Valve: The tricuspid valve is normal in structure. Tricuspid valve regurgitation is trivial. Aortic Valve: The aortic valve is tricuspid. Aortic valve regurgitation is not visualized. Aortic valve sclerosis/calcification is present, without any evidence of aortic stenosis. Aortic valve mean gradient measures 5.3 mmHg. Aortic valve peak gradient measures 9.2 mmHg. Aortic valve area, by VTI measures 2.22 cm. Pulmonic Valve: The pulmonic valve was not well visualized. Pulmonic valve regurgitation is not visualized. No evidence of pulmonic stenosis. Aorta: The aortic root and ascending aorta are structurally normal, with no evidence of dilitation. Venous: The inferior vena cava is normal in size with greater than 50% respiratory variability,  suggesting right atrial pressure of 3 mmHg. IAS/Shunts: No atrial level shunt detected by color flow Doppler.  LEFT VENTRICLE PLAX 2D LVIDd:         3.95 cm LVIDs:         3.00 cm LV PW:  1.10 cm LV IVS:        1.10 cm LVOT diam:     1.90 cm LV SV:         48 LV SV Index:   24 LVOT Area:     2.84 cm  RIGHT VENTRICLE         IVC TAPSE (M-mode): 1.8 cm  IVC diam: 1.80 cm LEFT ATRIUM              Index        RIGHT ATRIUM           Index LA diam:        3.90 cm  1.93 cm/m   RA Area:     14.30 cm LA Vol (A2C):   94.9 ml  46.89 ml/m  RA Volume:   34.90 ml  17.24 ml/m LA Vol (A4C):   96.0 ml  47.43 ml/m LA Biplane Vol: 102.0 ml 50.40 ml/m  AORTIC VALVE                     PULMONIC VALVE AV Area (Vmax):    1.90 cm      PV Vmax:       0.74 m/s AV Area (Vmean):   1.77 cm      PV Peak grad:  2.2 mmHg AV Area (VTI):     2.22 cm AV Vmax:           152.00 cm/s AV Vmean:          107.000 cm/s AV VTI:            0.217 m AV Peak Grad:      9.2 mmHg AV Mean Grad:      5.3 mmHg LVOT Vmax:         101.73 cm/s LVOT Vmean:        66.833 cm/s LVOT VTI:          0.170 m LVOT/AV VTI ratio: 0.78  AORTA Ao Root diam: 2.80 cm Ao Asc diam:  3.20 cm MITRAL VALVE MV Area (PHT): 4.17 cm     SHUNTS MV Decel Time: 182 msec     Systemic VTI:  0.17 m MV E velocity: 126.00 cm/s  Systemic Diam: 1.90 cm Dietrich Pates MD Electronically signed by Dietrich Pates MD Signature Date/Time: 06/16/2023/4:32:56 PM    Final     Patient Profile     82 y.o. female  with a hx of hypertension, hyperlipidemia, prior smoking, morbid obesity who is being seen 06/17/2023 for the evaluation of new onset afib and CHF at the request of Dr. Alanda Slim.   Assessment & Plan    Acute/new onset afib with RVR   Patient admitted with new onset afib with RVR, symptoms beginning on 06/15/23. She was initially treated with diltiazem and then also received one time dose of digoxin on 06/16/23. Now on metoprolol succinate 25mg  with moderate rate control but complete  resolution of palpitation symptoms.   HR improved and now in the 90 to low 100;s Transition to Toprol XL 100mg  daily this am Continue Apixaban 5mg  BID Given lack of symptoms on beta blocker, if adequate rate control achieved, would be reasonable to plan for outpatient DCCV following 3 weeks of uninterrupted OAC but could also consider inpatient TEE/DCCV.  Patient certainly at risk of OSA with elevated BMI, would suggest outpatient sleep study. Will make NPO after MN  tomorrow night for possible TEE/DCCV on Thursday if HR not under 100bpm  in am    Acute HFmrEF   Patient admitted with new afib, also found to have moderately reduced LVEF at 45-50%. Noted with pulmonary vascular congestion and elevated BNP (207.3) this admission as well). Class III symptoms in the last few days.    Suspect that patient's LVEF reduction is secondary to new onset afib with RVR.  She does not appear volume overloaded on exam today Per HF TOC clinical pharmacy team, Entresto, Jardiance/Farxiga both $0.  Continue Jardiance 10mg  daily Start Entresto 24-26mg  BID If BP remains stable add Spiro 12.5mg  daily    Total time spent with patient today 35 minutes. This includes reviewing records, evaluating the patient and coordinating care. Face-to-face time >50%.  For questions or updates, please contact [facility] Please consult [email or web address] for contact info under        Signed, Armanda Magic, MD  06/18/2023, 9:39 AM

## 2023-06-18 NOTE — Progress Notes (Signed)
Mobility Specialist Progress Note:    06/18/23 1203  Mobility  Activity Ambulated with assistance in hallway  Level of Assistance Standby assist, set-up cues, supervision of patient - no hands on  Assistive Device Other (Comment) (Hand railing)  Distance Ambulated (ft) 120 ft  Activity Response Tolerated well  Mobility Referral Yes  $Mobility charge 1 Mobility  Mobility Specialist Start Time (ACUTE ONLY) 1020  Mobility Specialist Stop Time (ACUTE ONLY) 1035  Mobility Specialist Time Calculation (min) (ACUTE ONLY) 15 min   Received pt sitting EOB having no complaints and agreeable to mobility. Pt was asymptomatic throughout ambulation and returned to room w/o fault. Left seated EOB w/ call bell in reach and all needs met.   Thompson Grayer Mobility Specialist  Please contact vis Secure Chat or  Rehab Office [phone]

## 2023-06-18 NOTE — Plan of Care (Signed)
  Problem: Education: Goal: Knowledge of General Education information will improve Description: Including pain rating scale, medication(s)/side effects and non-pharmacologic comfort measures Outcome: Progressing   Problem: Health Behavior/Discharge Planning: Goal: Ability to manage health-related needs will improve Outcome: Progressing   Problem: Clinical Measurements: Goal: Cardiovascular complication will be avoided Outcome: Progressing   Problem: Activity: Goal: Risk for activity intolerance will decrease Outcome: Progressing   Problem: Pain Managment: Goal: General experience of comfort will improve Outcome: Progressing   Problem: Safety: Goal: Ability to remain free from injury will improve Outcome: Progressing   Problem: Skin Integrity: Goal: Risk for impaired skin integrity will decrease Outcome: Progressing   

## 2023-06-19 DIAGNOSIS — I509 Heart failure, unspecified: Secondary | ICD-10-CM | POA: Diagnosis not present

## 2023-06-19 DIAGNOSIS — I10 Essential (primary) hypertension: Secondary | ICD-10-CM | POA: Diagnosis not present

## 2023-06-19 DIAGNOSIS — I5021 Acute systolic (congestive) heart failure: Secondary | ICD-10-CM | POA: Diagnosis not present

## 2023-06-19 DIAGNOSIS — I4891 Unspecified atrial fibrillation: Secondary | ICD-10-CM | POA: Diagnosis not present

## 2023-06-19 DIAGNOSIS — I420 Dilated cardiomyopathy: Secondary | ICD-10-CM

## 2023-06-19 LAB — RENAL FUNCTION PANEL
Albumin: 3.4 g/dL — ABNORMAL LOW (ref 3.5–5.0)
Anion gap: 8 (ref 5–15)
BUN: 21 mg/dL (ref 8–23)
CO2: 25 mmol/L (ref 22–32)
Calcium: 9.7 mg/dL (ref 8.9–10.3)
Chloride: 104 mmol/L (ref 98–111)
Creatinine, Ser: 0.64 mg/dL (ref 0.44–1.00)
GFR, Estimated: >60 mL/min (ref >60)
Glucose, Bld: 114 mg/dL — ABNORMAL HIGH (ref 70–99)
Phosphorus: 4.2 mg/dL (ref 2.5–4.6)
Potassium: 4.1 mmol/L (ref 3.5–5.1)
Sodium: 137 mmol/L (ref 135–145)

## 2023-06-19 LAB — CBC
HCT: 41.8 % (ref 36.0–46.0)
Hemoglobin: 13.7 g/dL (ref 12.0–15.0)
MCH: 28.4 pg (ref 26.0–34.0)
MCHC: 32.8 g/dL (ref 30.0–36.0)
MCV: 86.5 fL (ref 80.0–100.0)
Platelets: 301 10*3/uL (ref 150–400)
RBC: 4.83 MIL/uL (ref 3.87–5.11)
RDW: 13.5 % (ref 11.5–15.5)
WBC: 9.2 10*3/uL (ref 4.0–10.5)
nRBC: 0 % (ref 0.0–0.2)

## 2023-06-19 LAB — MAGNESIUM: Magnesium: 2.2 mg/dL (ref 1.7–2.4)

## 2023-06-19 MED ORDER — SALINE SPRAY 0.65 % NA SOLN
1.0000 | NASAL | Status: DC | PRN
  Filled 2023-06-19 (×2): qty 44

## 2023-06-19 NOTE — Plan of Care (Signed)

## 2023-06-19 NOTE — Progress Notes (Signed)
Occupational Therapy Treatment Patient Details Name: Bailey Solomon MRN: [id number] DOB: 10-24-1941 Today's Date: 06/19/2023   History of present illness 82 year old F presenting with palpitation and shortness of breath and admitted with new onset A-fib with RVR and possible CHF. PMH of HTN, HLD, prior smoking and morbid obesity   OT comments  Pt. Seen for skilled OT treatment session with focus on energy conservation and breathing strategies to utilize during adls and mobility. Pt. Receptive to education and handouts.  Able to provide examples of strategies that she already utilizes at home for energy conservation.  Reviewed PLB, pt. Verbalized understanding.  Describes good family support at home.  Eager for d/c home when able.     Recommendations for follow up therapy are one component of a multi-disciplinary discharge planning process, led by the attending physician.  Recommendations may be updated based on patient status, additional functional criteria and insurance authorization.    Assistance Recommended at Discharge Intermittent Supervision/Assistance  Patient can return home with the following  Assist for transportation;Assistance with cooking/housework   Equipment Recommendations  None recommended by OT    Recommendations for Other Services      Precautions / Restrictions Precautions Precautions: Fall Precaution Comments: watch HR       Mobility Bed Mobility                    Transfers                         Balance                                           ADL either performed or assessed with clinical judgement   ADL                                         General ADL Comments: energy conservation strategies handouts provided and reviewed with pt., along with chf/ breathing strategies.  pt. able to describe current modifications ie: does dishes and grooming tasks seated.  reviewed breathing strategies pt.  refers to it as "yoga breathing" and states she knew how to do it already.  encouraged return demo and also reviewed times that she should limit talking during higher exertion activities, ie: eating, walking, b/d until she feels managed or if she notes changes with increased sob to feel comfortable telling the person that she can not talk right now and then focus on breathing strategies    Extremity/Trunk Assessment              Vision       Perception     Praxis      Cognition Arousal/Alertness: Awake/alert Behavior During Therapy: WFL for tasks assessed/performed Overall Cognitive Status: Within Functional Limits for tasks assessed                                          Exercises      Shoulder Instructions       General Comments  Loves her cat and her children's dogs who are also at the house with her.  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  Pertinent Vitals/ Pain       Pain Assessment Pain Assessment: No/denies pain  Home Living                                          Prior Functioning/Environment              Frequency  Min 1X/week        Progress Toward Goals  OT Goals(current goals can now be found in the care plan section)  Progress towards OT goals: Progressing toward goals     Plan Discharge plan remains appropriate    Co-evaluation                 AM-PAC OT "6 Clicks" Daily Activity     Outcome Measure   Help from another person eating meals?: None Help from another person taking care of personal grooming?: None Help from another person toileting, which includes using toliet, bedpan, or urinal?: A Little Help from another person bathing (including washing, rinsing, drying)?: None Help from another person to put on and taking off regular upper body clothing?: None Help from another person to put on and taking off regular lower body clothing?: None 6 Click Score: 23    End of Session     OT Visit Diagnosis: Unsteadiness on feet (R26.81)   Activity Tolerance Patient tolerated treatment well   Patient Left in bed;with call bell/phone within reach   Nurse Communication          Time: 4098-1191 OT Time Calculation (min): 26 min  Charges: OT General Charges $OT Visit: 1 Visit OT Treatments $Self Care/Home Management : 23-37 mins  Boneta Lucks, COTA/L Acute Rehabilitation [phone]   Alessandra Bevels Lorraine-COTA/L 06/19/2023, 2:13 PM

## 2023-06-19 NOTE — TOC Initial Note (Signed)
Transition of Care [facility]) - Initial/Assessment Note    Patient Details  Name: Bailey Solomon MRN: [id number] Date of Birth: [birth year]  Transition [facility]) CM/SW Contact:    Gala Lewandowsky, RN Phone Number: 06/19/2023, 1:59 PM  Clinical Narrative:  Patient presented for tachycardia. Plan for cardioversion on Thursday. PTA patient presented from home with the support of son. Patient states she still drives and has DME: cane, rolling walker, and wheelchair in the home. Case Manager discussed home health services and patient is agreeable to physical therapy with [facility]- referral made and start of care to begin within 24-48 hours post transition of care. Case Manager will continue to follow for additional needs.                  Expected Discharge Plan: Home w Home Health Services Barriers to Discharge: Continued Medical Work up   Patient Goals and CMS Choice Patient states their goals for this hospitalization and ongoing recovery are:: to return home.   Choice offered to / list presented to : NA (Patient did not have a preference.)    Expected Discharge Plan and Services In-house Referral: NA Discharge Planning Services: CM Consult Post Acute Care Choice: Home Health Living arrangements for the past 2 months: Single Family Home                 DME Arranged: N/A  HH Arranged: PT HH Agency: [facility] Date HH Agency Contacted: 06/19/23 Time HH Agency Contacted: 1145 Representative spoke with at [facility] Agency: Tresa Endo  Prior Living Arrangements/Services Living arrangements for the past 2 months: Single Family Home Lives with:: Adult Children, Self Patient language and need for interpreter reviewed:: Yes Do you feel safe going back to the place where you live?: Yes      Need for Family Participation in Patient Care: Yes (Comment)   Current home services: DME (cane, rolling walker, wheelchair x2,) Criminal Activity/Legal Involvement  Pertinent to Current Situation/Hospitalization: No - Comment as needed Permission Sought/Granted Permission sought to share information with : Family Supports, Magazine features editor, Case Estate manager/land agent granted to share information with : Yes, Verbal Permission Granted     Permission granted to share info w AGENCY: [facility]    Emotional Assessment Appearance:: Appears stated age Attitude/Demeanor/Rapport: Engaged Affect (typically observed): Appropriate Orientation: : Oriented to Self, Oriented to Place, Oriented to  Time, Oriented to Situation Alcohol / Substance Use: Not Applicable Psych Involvement: No (comment)  Admission diagnosis:  Atrial fibrillation with RVR (HCC) [I48.91] Patient Active Problem List   Diagnosis Date Noted   Atrial fibrillation with RVR (HCC) 06/15/2023   New onset of congestive heart failure (HCC) 06/15/2023   HTN (hypertension) 06/15/2023   Arthritis of knee 08/03/2014   PCP:  Kaleen Mask, MD Pharmacy:   [address] [place], [place] - [address] [place] [address] Phone: [phone] Fax: [phone]  [facility] - [place], [place] - [address] [place] [address] Phone: [phone] Fax: [phone]  [id number] - [place], [place] - [address] AT [address]NER OF [facility] [address] [place] [address] Phone: [phone] Fax: [phone]  [facility] [address] [place] [address] Phone: [phone] Fax: [phone]  Social Determinants of Health (SDOH) Social History: SDOH Screenings   Food Insecurity: No Food Insecurity (06/16/2023)  Housing: Low Risk  (  06/16/2023)  Transportation Needs: No Transportation Needs (06/16/2023)  Utilities: Not At Risk (06/16/2023)  Social Connections: Unknown (04/09/2022)   Received from [facility]   Tobacco Use: Medium Risk (06/15/2023)   Readmission Risk Interventions     No data to display

## 2023-06-19 NOTE — Progress Notes (Signed)
Patient Name: Bailey Solomon Date of Encounter: 06/19/2023 [facility] Cardiologist: None   Interval Summary  .    Patient is very frustrated that she is still in the hospital. Denies chest pain, palpitations. Reports that her allergies are acting up and she has a lot of nasal/sinus congestion. This congestion makes her feel a bit dizzy. Denies dizziness otherwise   Vital Signs .    Vitals:   06/19/23 0038 06/19/23 0500 06/19/23 0539 06/19/23 0833  BP:   (!) 109/57 (!) 87/48  Pulse: 95  90 88  Resp: 19  19 18   Temp: 98 F (36.7 C)  97.8 F (36.6 C) 97.8 F (36.6 C)  TempSrc: Oral  Oral Oral  SpO2: 95%  95%   Weight:  105.6 kg    Height:        Intake/Output Summary (Last 24 hours) at 06/19/2023 0904 Last data filed at 06/19/2023 0550 Gross per 24 hour  Intake 840 ml  Output 2300 ml  Net -1460 ml      06/19/2023    5:00 AM 06/15/2023   11:37 PM 03/01/2022    8:35 AM  Last 3 Weights  Weight (lbs) 232 lb 11.2 oz 235 lb 4.8 oz 240 lb  Weight (kg) 105.552 kg 106.731 kg 108.863 kg      Telemetry/ECG    Atrial fibrillation, HR int he 70s-90s - Personally Reviewed  Physical Exam .   GEN: No acute distress.  Laying flat in bed  Neck: No JVD Cardiac: Irregular rate and rhythm, no murmurs, rubs, or gallops.  Respiratory: Anterior lung exam clear. Normal work of breathing on room air  GI: Soft, nontender, non-distended  MS: No edema in BLE   Assessment & Plan .     New Onset Atrial Fibrillation with RVR  - Patient admitted with new onset afib with RVR. Symptomst began on 7/20. She was initially treated with diltiazem and received one dose of digoxin on 7/21 - Now, patient is on metoprolol succinate 100 mg daily - Per telemetry, patient remains in afib with HR in the 70s-80s  - Scheduled for TEE/DCCV tomorrow. Discussed risks and benefits of the procedure and patient willing to proceed  - Continue eliquis 5 mg BID   Acute HFmrEF  - Echocardiogram this  admission showed EF 45-50%, normal RV systolic function.  - Likely that reduced EF is secondary to new onset afib with RVR  - Anticipate repeat echo a few months after restoring normal sinus rhythm  - Continue jardiance 10 mg daily  - Continue entresto 24-26 mg BID  - Continue metoprolol succinate 100 mg daily  - Note- BP was as low as 87/49. Improved on recheck to 110/60. If BP continues to be low, may need to reduce entresto to losartan  - No BP room to add spironolactone at this time   Informed Consent   Shared Decision Making/Informed Consent The risks [stroke, cardiac arrhythmias rarely resulting in the need for a temporary or permanent pacemaker, skin irritation or burns, esophageal damage, perforation (1:10,000 risk), bleeding, pharyngeal hematoma as well as other potential complications associated with conscious sedation including aspiration, arrhythmia, respiratory failure and death], benefits (treatment guidance, restoration of normal sinus rhythm, diagnostic support) and alternatives of a transesophageal echocardiogram guided cardioversion were discussed in detail with Bailey Solomon and she is willing to proceed.      For questions or updates, please contact [facility] Please consult [email or web address] for contact info under  Signed, Bailey Albee, PA-C

## 2023-06-19 NOTE — Progress Notes (Addendum)
PROGRESS NOTE    Bailey Solomon  ZOX:[id number] DOB: [birth year] DOA: 06/15/2023 PCP: Kaleen Mask, MD    Brief Narrative:   82 year old female with past medical history of hypertension, hyperlipidemia, smoking, morbid obesity presented to hospital with palpitations, shortness of breath.  Patient was noted to have new onset atrial fibrillation with RVR in the ED with possible congestive heart failure.  Chest x-ray showed pulmonary vascular congestion, BNP was 207.  Patient was started on Cardizem drip, IV heparin and Lasix and was admitted hospital for further evaluation and treatment.  Cardiology was consulted.  Currently awaiting for adequate control if not plan for TEE/DCCV likely on 06/20/23  Assessment and plan.  New onset atrial fibrillation with RVR:   CHA2DS2-VASc score 5.  Cardiology on board and dose of Toprol-XL was increased to 100 mg.  Patient also received 1 dose of digoxin.  On Eliquis 5 mg twice daily.  Continue to monitor and optimize electrolytes, advised to avoid over-the-counter nasal decongestant.  Patient might benefit from sleep apnea study as outpatient.As per cardiology, possible TEE/DCCV on 7/25   Acute heart failure with mildly reduced EF: Presented with shortness of breath and palpitation, peripheral edema..  Chest x-ray with vascular congestion.    BNP 207 but could be falsely low due to morbid obesity. TTE with LVEF of 45 to 50% but in the setting of RVR.  Received IV Lasix in ED. Cardiology has started [place] and entresto.  Continue Toprol-XL.  Continue strict intake and output charting, daily weights   Essential hypertension:  On Toprol-XL 100 mg daily.Latest blood pressure of 109/57.  Hypokalemia Improved after replacement.  Latest potassium of 4.1 and magnesium 2.2.   Anxiety: Was on Valium in the past.  Continue lorazepam 0.5 mg twice daily as needed   Morbid obesity Body mass index is 44.46 kg/m.  Would benefit from weight loss as  outpatient.  Had a fall at home.  Will need PT evaluation after DC cardioversion.     DVT prophylaxis:  apixaban (ELIQUIS) tablet 5 mg   Code Status:     Code Status: DNR  Disposition: Home likely 1 to 2 days.  Status is: Inpatient  Remains inpatient appropriate because: Atrial fibrillation with RVR, need for cardioversion.   Family Communication: None at bedside  Consultants:  Cardiology  Procedures:  None yet  Antimicrobials:  None  Anti-infectives (From admission, onward)    None      Subjective: Today, patient was seen and examined at bedside.  Patient feels frustrated about current hospital stay stating itching in the body unable to take shower.  Denies any chest pain or palpitations.  Complains of nasal congestion and allergy.  Objective: Vitals:   06/19/23 0500 06/19/23 0539 06/19/23 0833 06/19/23 0856  BP:  (!) 109/57 (!) 87/48 (!) 110/98  Pulse:  90 88   Resp:  19 18   Temp:  97.8 F (36.6 C) 97.8 F (36.6 C)   TempSrc:  Oral Oral   SpO2:  95%    Weight: 105.6 kg     Height:        Intake/Output Summary (Last 24 hours) at 06/19/2023 1254 Last data filed at 06/19/2023 0550 Gross per 24 hour  Intake 600 ml  Output 2300 ml  Net -1700 ml   Filed Weights   06/15/23 2337 06/19/23 0500  Weight: 106.7 kg 105.6 kg    Physical Examination: Body mass index is 43.97 kg/m.  General:   not in obvious distress,  obese built HENT:   No scleral pallor or icterus noted. Oral mucosa is moist.  Chest:  Diminished breath sounds bilaterally. No crackles or wheezes.  CVS: S1 &S2 heard. No murmur.  Irregularly irregular rhythm. Abdomen: Soft, nontender, nondistended.  Bowel sounds are heard.   Extremities: No cyanosis, clubbing or edema.  Peripheral pulses are palpable. Psych: Alert, awake and oriented, feels frustrated. CNS:  No cranial nerve deficits.  Power equal in all extremities.   Skin: Warm and dry.  No rashes noted.  Data Reviewed:   CBC: Recent  Labs  Lab 06/15/23 1832 06/16/23 0557 06/17/23 0426 06/18/23 0033 06/19/23 0645  WBC 12.3* 10.0 10.6* 8.5 9.2  HGB 12.5 12.3 12.7 11.7* 13.7  HCT 39.2 38.3 40.2 36.5 41.8  MCV 86.9 86.7 88.9 86.1 86.5  PLT 264 262 238 251 301    Basic Metabolic Panel: Recent Labs  Lab 06/15/23 1832 06/16/23 0557 06/17/23 0426 06/18/23 0033 06/19/23 0645  NA 138 137 137 134* 137  K 3.7 3.2* 3.9 3.9 4.1  CL 104 102 104 102 104  CO2 25 26 24 24 25   GLUCOSE 101* 118* 144* 135* 114*  BUN 16 16 18 22 21   CREATININE 0.75 0.76 0.78 0.70 0.64  CALCIUM 9.5 9.0 9.1 9.1 9.7  MG 2.1 2.0 2.0 2.0 2.2  PHOS  --   --  2.4* 4.0 4.2    Liver Function Tests: Recent Labs  Lab 06/17/23 0426 06/18/23 0033 06/19/23 0645  ALBUMIN 3.3* 3.0* 3.4*     Radiology Studies: No results found.    LOS: 2 days    Joycelyn Das, MD [facility] Available via Epic secure chat 7am-7pm After these hours, please refer to coverage provider listed on [email or web address] 06/19/2023, 12:54 PM

## 2023-06-19 NOTE — Progress Notes (Signed)
Mobility Specialist Progress Note:    06/19/23 1210  Mobility  Activity Ambulated with assistance in hallway  Level of Assistance Standby assist, set-up cues, supervision of patient - no hands on  Assistive Device Cane (occassional use of hand railing)  Distance Ambulated (ft) 20 ft  Activity Response Tolerated well  Mobility Referral Yes  $Mobility charge 1 Mobility  Mobility Specialist Start Time (ACUTE ONLY) 1143  Mobility Specialist Stop Time (ACUTE ONLY) 1150  Mobility Specialist Time Calculation (min) (ACUTE ONLY) 7 min   Received pt sitting EOB having no complaints and agreeable to mobility. Pt was asymptomatic throughout ambulation and returned to room w/o fault. Left in sitting EOB w/ call bell in reach and all needs met.   Thompson Grayer Mobility Specialist  Please contact vis Secure Chat or  Rehab Office [phone]

## 2023-06-19 NOTE — Progress Notes (Signed)
   Heart Failure Stewardship Pharmacist Progress Note   PCP: Kaleen Mask, MD PCP-Cardiologist: None    HPI:  82 yo F with PMH of HTN, HLD, prior smoking, and obesity.   Presented to the ED on 7/20 with shortness of breath and palpitations. CXR with pulmonary vascular congestion. BNP 207. Found to be in afib RVR. ECHO on 7/21 showed LVEF 45-50%, RV normal, trivial MR. Plan for TEE/DCCV on 7/25.   Current HF Medications: Beta Blocker: metoprolol XL 100 mg daily ACE/ARB/ARNI: Entresto 24/26 mg BID SGLT2i: Jardiance 10 mg daily  Prior to admission HF Medications: None  Pertinent Lab Values: Serum creatinine 0.64, BUN 21, Potassium 4.1, Sodium 137, BNP 207.3, Magnesium 2.2  Vital Signs: Weight: 232 lbs (admission weight: 235 lbs) Blood pressure: 80/50-110/90s  Heart rate: 80-100s  I/O: net -2.9L since admission  Medication Assistance / Insurance Benefits Check: Does the patient have prescription insurance?  Yes Type of insurance plan: [place] Medicaid   Outpatient Pharmacy:  [facility] the patient willing to use [facility] TOC pharmacy at discharge? Yes Is the patient willing to transition their outpatient pharmacy to utilize a [facility] outpatient pharmacy?   No    Assessment: 1. 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 NYHA class III symptoms. - Off IV lasix. Strict I/Os and daily weights. Keep K>4 and Mg>2. - Continue metoprolol XL 100 mg daily - Continue Entresto 24/26 mg BID - Consider MRA prior to discharge if BP improves - Continue Jardiance 10 mg daily - Stop pseudoephedrine at discharge   Plan: 1) Medication changes recommended at this time: - Stop pseudoephedrine at discharge  2) Patient assistance: - Farxiga/Jardiance copay $0 - Entresto copay $0  3)  Education  - Patient has been educated on current HF medications and potential additions to HF medication regimen - Patient  verbalizes understanding that over the next few months, these medication doses may change and more medications may be added to optimize HF regimen - Patient has been educated on basic disease state pathophysiology and goals of therapy   Sharen Hones, PharmD, BCPS Heart Failure Stewardship Pharmacist Phone [phone]

## 2023-06-20 ENCOUNTER — Encounter (HOSPITAL_COMMUNITY)
Admission: EM | Disposition: A | Discharge status: Home / Self Care | Payer: Self-pay | Source: Home / Self Care | Attending: Student

## 2023-06-20 ENCOUNTER — Inpatient Hospital Stay (HOSPITAL_COMMUNITY): Payer: 59 | Admitting: Registered Nurse

## 2023-06-20 ENCOUNTER — Encounter (HOSPITAL_COMMUNITY): Payer: Self-pay | Admitting: Internal Medicine

## 2023-06-20 ENCOUNTER — Other Ambulatory Visit (HOSPITAL_COMMUNITY): Payer: Self-pay

## 2023-06-20 ENCOUNTER — Inpatient Hospital Stay (HOSPITAL_COMMUNITY): Payer: 59

## 2023-06-20 DIAGNOSIS — I509 Heart failure, unspecified: Secondary | ICD-10-CM | POA: Diagnosis not present

## 2023-06-20 DIAGNOSIS — I5021 Acute systolic (congestive) heart failure: Secondary | ICD-10-CM | POA: Diagnosis not present

## 2023-06-20 DIAGNOSIS — I10 Essential (primary) hypertension: Secondary | ICD-10-CM | POA: Diagnosis not present

## 2023-06-20 DIAGNOSIS — I110 Hypertensive heart disease with heart failure: Secondary | ICD-10-CM | POA: Diagnosis not present

## 2023-06-20 DIAGNOSIS — Z87891 Personal history of nicotine dependence: Secondary | ICD-10-CM | POA: Diagnosis not present

## 2023-06-20 DIAGNOSIS — I340 Nonrheumatic mitral (valve) insufficiency: Secondary | ICD-10-CM | POA: Diagnosis not present

## 2023-06-20 DIAGNOSIS — I420 Dilated cardiomyopathy: Secondary | ICD-10-CM | POA: Diagnosis not present

## 2023-06-20 DIAGNOSIS — I4891 Unspecified atrial fibrillation: Secondary | ICD-10-CM

## 2023-06-20 HISTORY — PX: CARDIOVERSION: SHX1299

## 2023-06-20 HISTORY — PX: TEE WITHOUT CARDIOVERSION: SHX5443

## 2023-06-20 LAB — BASIC METABOLIC PANEL
Anion gap: 7 (ref 5–15)
BUN: 26 mg/dL — ABNORMAL HIGH (ref 8–23)
Calcium: 9.6 mg/dL (ref 8.9–10.3)
Chloride: 103 mmol/L (ref 98–111)
Creatinine, Ser: 0.87 mg/dL (ref 0.44–1.00)
Glucose, Bld: 114 mg/dL — ABNORMAL HIGH (ref 70–99)
Potassium: 4 mmol/L (ref 3.5–5.1)

## 2023-06-20 LAB — CBC
HCT: 41.8 % (ref 36.0–46.0)
Hemoglobin: 13.5 g/dL (ref 12.0–15.0)
MCH: 27.6 pg (ref 26.0–34.0)
MCHC: 32.3 g/dL (ref 30.0–36.0)
Platelets: 349 10*3/uL (ref 150–400)
RBC: 4.89 MIL/uL (ref 3.87–5.11)
RDW: 13.7 % (ref 11.5–15.5)
WBC: 10.6 10*3/uL — ABNORMAL HIGH (ref 4.0–10.5)
nRBC: 0 % (ref 0.0–0.2)

## 2023-06-20 LAB — ECHO TEE

## 2023-06-20 LAB — MAGNESIUM: Magnesium: 2.3 mg/dL (ref 1.7–2.4)

## 2023-06-20 SURGERY — ECHOCARDIOGRAM, TRANSESOPHAGEAL
Anesthesia: Monitor Anesthesia Care

## 2023-06-20 MED ORDER — ETOMIDATE 2 MG/ML IV SOLN
INTRAVENOUS | Status: DC | PRN
  Administered 2023-06-20: 6 mg via INTRAVENOUS

## 2023-06-20 MED ORDER — LOSARTAN POTASSIUM 25 MG PO TABS
25.0000 mg | ORAL_TABLET | Freq: Every day | ORAL | 2 refills | Status: AC
  Filled 2023-06-20: qty 30, 30d supply, fill #0

## 2023-06-20 MED ORDER — OXYCODONE HCL 5 MG PO TABS: 5.0000 mg | ORAL_TABLET | Freq: Once | ORAL | Status: DC | PRN

## 2023-06-20 MED ORDER — PROPOFOL 500 MG/50ML IV EMUL
INTRAVENOUS | Status: DC | PRN
  Administered 2023-06-20: 100 ug/kg/min via INTRAVENOUS

## 2023-06-20 MED ORDER — OXYCODONE HCL 5 MG/5ML PO SOLN: 5.0000 mg | Freq: Once | ORAL | Status: DC | PRN

## 2023-06-20 MED ORDER — LIDOCAINE 2% (20 MG/ML) 5 ML SYRINGE
INTRAMUSCULAR | Status: DC | PRN
  Administered 2023-06-20: 60 mg via INTRAVENOUS

## 2023-06-20 MED ORDER — PROPOFOL 10 MG/ML IV BOLUS
INTRAVENOUS | Status: DC | PRN
Start: 2023-06-20 — End: 2023-06-20
  Administered 2023-06-20: 20 mg via INTRAVENOUS

## 2023-06-20 MED ORDER — METOPROLOL SUCCINATE ER 50 MG PO TB24
50.0000 mg | ORAL_TABLET | Freq: Every day | ORAL | 2 refills | Status: DC
  Filled 2023-06-20: qty 30, 30d supply, fill #0

## 2023-06-20 MED ORDER — FENTANYL CITRATE (PF) 100 MCG/2ML IJ SOLN: 25.0000 ug | INTRAMUSCULAR | Status: DC | PRN

## 2023-06-20 MED ORDER — SODIUM CHLORIDE 0.9 % IV SOLN: INTRAVENOUS | Status: DC

## 2023-06-20 MED ORDER — METOPROLOL SUCCINATE ER 50 MG PO TB24: 50.0000 mg | ORAL_TABLET | Freq: Every day | ORAL | Status: DC

## 2023-06-20 MED ORDER — APIXABAN 5 MG PO TABS
5.0000 mg | ORAL_TABLET | Freq: Two times a day (BID) | ORAL | 2 refills | Status: DC
  Filled 2023-06-20: qty 60, 30d supply, fill #0

## 2023-06-20 MED ORDER — ONDANSETRON HCL 4 MG/2ML IJ SOLN: 4.0000 mg | Freq: Four times a day (QID) | INTRAMUSCULAR | Status: DC | PRN

## 2023-06-20 MED ORDER — LOSARTAN POTASSIUM 25 MG PO TABS: 25.0000 mg | ORAL_TABLET | Freq: Every day | ORAL | Status: DC

## 2023-06-20 SURGICAL SUPPLY — 1 items: ELECT DEFIB PAD ADLT CADENCE (PAD) ×1 IMPLANT

## 2023-06-20 NOTE — Anesthesia Preprocedure Evaluation (Signed)
Anesthesia Evaluation  Patient identified by MRN, date of birth, ID band Patient awake    Reviewed: Allergy & Precautions, H&P , NPO status , Patient's Chart, lab work & pertinent test results  Airway Mallampati: II   Neck ROM: full    Dental   Pulmonary former smoker   breath sounds clear to auscultation       Cardiovascular hypertension, +CHF  + dysrhythmias Atrial Fibrillation  Rhythm:irregular Rate:Normal     Neuro/Psych  PSYCHIATRIC DISORDERS Anxiety Depression       GI/Hepatic   Endo/Other    Morbid obesity  Renal/GU      Musculoskeletal  (+) Arthritis ,    Abdominal   Peds  Hematology   Anesthesia Other Findings   Reproductive/Obstetrics                             Anesthesia Physical Anesthesia Plan  ASA: 3  Anesthesia Plan: MAC   Post-op Pain Management:    Induction: Intravenous  PONV Risk Score and Plan: 2 and Propofol infusion and Treatment may vary due to age or medical condition  Airway Management Planned: Nasal Cannula  Additional Equipment:   Intra-op Plan:   Post-operative Plan:   Informed Consent: I have reviewed the patients History and Physical, chart, labs and discussed the procedure including the risks, benefits and alternatives for the proposed anesthesia with the patient or authorized representative who has indicated his/her understanding and acceptance.     Dental advisory given  Plan Discussed with: CRNA, Anesthesiologist and Surgeon  Anesthesia Plan Comments:        Anesthesia Quick Evaluation

## 2023-06-20 NOTE — Progress Notes (Signed)
PT Cancellation Note  Patient Details Name: Bailey Solomon MRN: [id number] DOB: 06/13/1941   Cancelled Treatment:    Reason Eval/Treat Not Completed: Patient at procedure or test/unavailable Pt noted to be out of room at this time. Will f/u as able.  Aleda Grana, PT, DPT 06/20/23, 12:26 PM   Sandi Mariscal 06/20/2023, 12:26 PM

## 2023-06-20 NOTE — H&P (View-Only) (Signed)
   Patient Name: Bailey Solomon Date of Encounter: 06/20/2023 [facility] Cardiologist: None   Interval Summary  .    No CP or SOB. Remains in atrial fibrillation in the 80-100's  Vital Signs .    Vitals:   06/19/23 1956 06/19/23 2320 06/20/23 0426 06/20/23 0600  BP: (!) 134/97 (!) 129/96 110/76   Pulse: (!) 109 95 77   Resp: 19 19 18    Temp: 97.7 F (36.5 C) 97.7 F (36.5 C) 98.2 F (36.8 C)   TempSrc: Axillary Axillary Oral   SpO2: 97% 95% 95%   Weight:    105.5 kg  Height:       No intake or output data in the 24 hours ending 06/20/23 0826     06/20/2023    6:00 AM 06/19/2023    5:00 AM 06/15/2023   11:37 PM  Last 3 Weights  Weight (lbs) 232 lb 9.4 oz 232 lb 11.2 oz 235 lb 4.8 oz  Weight (kg) 105.5 kg 105.552 kg 106.731 kg      Telemetry/ECG    Atrial fibrillation with HF 80-100's - Personally Reviewed  Physical Exam .   GEN: Well nourished, well developed in no acute distress HEENT: Normal NECK: No JVD; No carotid bruits LYMPHATICS: No lymphadenopathy CARDIAC:irregularly irregular, no murmurs, rubs, gallops RESPIRATORY:  Clear to auscultation without rales, wheezing or rhonchi  ABDOMEN: Soft, non-tender, non-distended MUSCULOSKELETAL:  No edema; No deformity  SKIN: Warm and dry NEUROLOGIC:  Alert and oriented x 3 PSYCHIATRIC:  Normal affect  Assessment & Plan .     New Onset Atrial Fibrillation with RVR  - Patient admitted with new onset afib with RVR. Symptomst began on 7/20. She was initially treated with diltiazem and received one dose of digoxin on 7/21 - Now, patient is on metoprolol succinate 100 mg daily - Per telemetry, patient remains in afib with HR in the 80-100's - She is NPO for TEE/DCCV today - Continue eliquis 5 mg BID   Acute HFmrEF  - Echocardiogram this admission showed EF 45-50%, normal RV systolic function.  - Likely that reduced EF is secondary to new onset afib with RVR  - Anticipate repeat echo a few months after  restoring normal sinus rhythm  - continue Toprol XL 100mg  daily - I think she is a poor candidate for SGLT2i due to her morbid obesity so I will stop her Jardiance - Entresto stopped yesterday due to soft BP - will start Losartan 25mg  daily and hopefully can transition to [facility] and start spiro as outpt if BP allows  She should be stable for discharge home after TEE/DCCV  [facility] will sign off.   Medication Recommendations: Toprol XL 100mg  daily, Losartan 25mg  daily, Eliquis 5mg  BID Other recommendations (labs, testing, etc):  none Follow up as an outpatient:  afib clinic in 2 weeks  Total time spent with patient today 35 minutes. This includes reviewing records, evaluating the patient and coordinating care. Face-to-face time >50%.      For questions or updates, please contact [facility] Please consult [email or web address] for contact info under        Signed, Armanda Magic, MD

## 2023-06-20 NOTE — Anesthesia Procedure Notes (Signed)
Procedure Name: MAC Date/Time: 06/20/2023 2:05 PM  Performed by: Macie Burows, CRNAPre-anesthesia Checklist: Patient identified, Emergency Drugs available, Suction available, Patient being monitored and Timeout performed Patient Re-evaluated:Patient Re-evaluated prior to induction Oxygen Delivery Method: Nasal cannula Induction Type: IV induction Placement Confirmation: positive ETCO2 and breath sounds checked- equal and bilateral Dental Injury: Teeth and Oropharynx as per pre-operative assessment

## 2023-06-20 NOTE — Progress Notes (Signed)
  Echocardiogram Echocardiogram Transesophageal has been performed.  Janalyn Harder 06/20/2023, 3:16 PM

## 2023-06-20 NOTE — Transfer of Care (Signed)
Immediate Anesthesia Transfer of Care Note  Patient: Bailey Solomon  Procedure(s) Performed: TRANSESOPHAGEAL ECHOCARDIOGRAM CARDIOVERSION  Patient Location: Cath Lab  Anesthesia Type:MAC  Level of Consciousness: drowsy  Airway & Oxygen Therapy: Patient Spontanous Breathing and Patient connected to nasal cannula oxygen  Post-op Assessment: Report given to RN and Post -op Vital signs reviewed and stable  Post vital signs: Reviewed and stable  Last Vitals:  Vitals Value Taken Time  BP 113/66 06/20/23 1426  Temp    Pulse 55 06/20/23 1428  Resp 8 06/20/23 1428  SpO2 90 % 06/20/23 1428    Last Pain:  Vitals:   06/20/23 1215  TempSrc: Temporal  PainSc: 0-No pain         Complications: No notable events documented.

## 2023-06-20 NOTE — CV Procedure (Signed)
     Transesophageal Echocardiogram Note  MILEE QUALLS [id number] 04/28/1941  Procedure: Transesophageal Echocardiogram Indications: AFib  Procedure Details Consent: Obtained Time Out: Verified patient identification, verified procedure, site/side was marked, verified correct patient position, special equipment/implants available, Radiology Safety Procedures followed,  medications/allergies/relevent history reviewed, required imaging and test results available.  Performed  Medications: Propofol 178mg  Etomidate 60mg     Left Ventrical:  LVEF 40%  Mitral Valve: Degenerative with mild MR  Aortic Valve: Tricuspid. No AR  Tricuspid Valve: Normal structure. Mild TR  Pulmonic Valve: Normal structure. Trivial PR  Left Atrium/ Left atrial appendage: No evidence of LAA thrombus  Atrial septum: No PFO by color doppler  Aorta: Grade III plaque  Following TEE, the patient underwent successful DCCV with 200J x1.   Complications: No apparent complications Patient did tolerate procedure well.  Meriam Sprague, MD 06/20/2023, 2:25 PM

## 2023-06-20 NOTE — Progress Notes (Signed)
D/c completed and AVS reviewed by Jefm Miles, RN

## 2023-06-20 NOTE — Progress Notes (Addendum)
   Patient Name: Bailey Solomon Date of Encounter: 06/20/2023 [facility] Cardiologist: None   Interval Summary  .    No CP or SOB. Remains in atrial fibrillation in the 80-100's  Vital Signs .    Vitals:   06/19/23 1956 06/19/23 2320 06/20/23 0426 06/20/23 0600  BP: (!) 134/97 (!) 129/96 110/76   Pulse: (!) 109 95 77   Resp: 19 19 18    Temp: 97.7 F (36.5 C) 97.7 F (36.5 C) 98.2 F (36.8 C)   TempSrc: Axillary Axillary Oral   SpO2: 97% 95% 95%   Weight:    105.5 kg  Height:       No intake or output data in the 24 hours ending 06/20/23 0826     06/20/2023    6:00 AM 06/19/2023    5:00 AM 06/15/2023   11:37 PM  Last 3 Weights  Weight (lbs) 232 lb 9.4 oz 232 lb 11.2 oz 235 lb 4.8 oz  Weight (kg) 105.5 kg 105.552 kg 106.731 kg      Telemetry/ECG    Atrial fibrillation with HF 80-100's - Personally Reviewed  Physical Exam .   GEN: Well nourished, well developed in no acute distress HEENT: Normal NECK: No JVD; No carotid bruits LYMPHATICS: No lymphadenopathy CARDIAC:irregularly irregular, no murmurs, rubs, gallops RESPIRATORY:  Clear to auscultation without rales, wheezing or rhonchi  ABDOMEN: Soft, non-tender, non-distended MUSCULOSKELETAL:  No edema; No deformity  SKIN: Warm and dry NEUROLOGIC:  Alert and oriented x 3 PSYCHIATRIC:  Normal affect  Assessment & Plan .     New Onset Atrial Fibrillation with RVR  - Patient admitted with new onset afib with RVR. Symptomst began on 7/20. She was initially treated with diltiazem and received one dose of digoxin on 7/21 - Now, patient is on metoprolol succinate 100 mg daily - Per telemetry, patient remains in afib with HR in the 80-100's - She is NPO for TEE/DCCV today - Continue eliquis 5 mg BID   Acute HFmrEF  - Echocardiogram this admission showed EF 45-50%, normal RV systolic function.  - Likely that reduced EF is secondary to new onset afib with RVR  - Anticipate repeat echo a few months after  restoring normal sinus rhythm  - continue Toprol XL 100mg  daily - I think she is a poor candidate for SGLT2i due to her morbid obesity so I will stop her Jardiance - Entresto stopped yesterday due to soft BP - will start Losartan 25mg  daily and hopefully can transition to [facility] and start spiro as outpt if BP allows  She should be stable for discharge home after TEE/DCCV  [facility] will sign off.   Medication Recommendations: Toprol XL 50mg  daily, Losartan 25mg  daily, Eliquis 5mg  BID Other recommendations (labs, testing, etc):  none Follow up as an outpatient:  afib clinic in 2 weeks  Total time spent with patient today 35 minutes. This includes reviewing records, evaluating the patient and coordinating care. Face-to-face time >50%.      For questions or updates, please contact [facility] Please consult [email or web address] for contact info under        Signed, Armanda Magic, MD

## 2023-06-20 NOTE — Discharge Summary (Addendum)
Physician Discharge Summary  Bailey Solomon ZOX:[id number] DOB: 10/27/1941 DOA: 06/15/2023  PCP: Bailey Mask, MD  Admit date: 06/15/2023 Discharge date: 06/20/2023  Admitted From: Home  Discharge disposition: Home    Recommendations for Outpatient Follow-Up:   Follow up with your primary care provider in one week.  Check CBC, BMP, magnesium in the next visit Follow-up with your Cardiology as scheduled by the clinic.   Discharge Diagnosis:   Principal Problem:   Atrial fibrillation with RVR (HCC) Active Problems:   New onset of congestive heart failure (HCC)   HTN (hypertension)   Acute systolic CHF (congestive heart failure) (HCC)   DCM (dilated cardiomyopathy) (HCC)   Discharge Condition: Improved.  Diet recommendation: Low sodium, heart healthy.    Wound care: None.  Code status: DNR   History of Present Illness:   82 year old female with past medical history of hypertension, hyperlipidemia, smoking, morbid obesity presented to hospital with palpitations, shortness of breath.  Patient was noted to have new onset atrial fibrillation with RVR in the ED with possible congestive heart failure.  Chest x-ray showed pulmonary vascular congestion, BNP was 207.  Patient was started on Cardizem drip, IV heparin and Lasix and was admitted hospital for further evaluation and treatment.    Hospital Course:   Following conditions were addressed during hospitalization as listed below,  Paroxysmal atrial fibrillation with RVR:   CHA2DS2-VASc score 5.  Cardiology was consulted and patient was adjusted on Toprol-XL.  Subsequently patient underwent DC cardioversion on 06/20/2023 with conversion to normal sinus rhythm.  Patient was started on Eliquis as well.  At this time Cardiology has considered the patient is stable for disposition home.  Patient will follow-up with cardiology as outpatient as has been scheduled.   Acute heart failure with mildly reduced EF: Presented  with shortness of breath and palpitation, peripheral edema..  Chest x-ray with vascular congestion.    BNP 207 but could be falsely low due to morbid obesity. TTE with LVEF of 45 to 50% but in the setting of RVR.  Received IV Lasix in ED. Cardiology has started [place] and entresto.  Continue Toprol-XL on discharge starting 06/21/2023.  At this time patient is a status post cardioversion and appears to be compensated.  Will follow-up with cardiology on discharge.  Essential hypertension:  On Toprol-XL 100 mg daily at home.  Will be changed to 50 mg daily starting 06/21/2023.   Hypokalemia Improved after replacement.  Potassium prior to discharge was 4.0   Anxiety: Was on Valium in the past.   Morbid obesity Body mass index is 44.46 kg/m.  Would benefit from weight loss as outpatient.   Disposition.  At this time, patient is stable for disposition home with outpatient PCP and cardiology follow-up.  Medical Consultants:   Cardiology  Procedures:    DCCV on 06/20/23 Subjective:   Today, patient was seen and examined at bedside.  Seen before DC cardioversion.  Denies any chest pain, shortness of breath, dizziness, lightheadedness.  Discharge Exam:   Vitals:   06/20/23 1524 06/20/23 1628  BP:  111/74  Pulse:  66  Resp:  18  Temp: (!) 97.5 F (36.4 C) 98.8 F (37.1 C)  SpO2:     Vitals:   06/20/23 1504 06/20/23 1508 06/20/23 1524 06/20/23 1628  BP:  110/62  111/74  Pulse: (!) 46 (!) 51  66  Resp: 13 19  18   Temp:   (!) 97.5 F (36.4 C) 98.8 F (37.1 C)  TempSrc:   Axillary Oral  SpO2: 92% 94%    Weight:      Height:       Body mass index is 43.95 kg/m.  General: Alert awake, not in obvious distress, morbidly obese HENT: pupils equally reacting to light,  No scleral pallor or icterus noted. Oral mucosa is moist.  Chest:  Clear breath sounds.  No crackles or wheezes.  CVS: S1 &S2 heard. No murmu, irregular rhythm Abdomen: Soft, nontender, nondistended.  Bowel sounds  are heard.   Extremities: No cyanosis, clubbing or edema.  Peripheral pulses are palpable. Psych: Alert, awake and oriented, normal mood CNS:  No cranial nerve deficits.  Power equal in all extremities.   Skin: Warm and dry.  No rashes noted.  The results of significant diagnostics from this hospitalization (including imaging, microbiology, ancillary and laboratory) are listed below for reference.     Diagnostic Studies:   ECHOCARDIOGRAM COMPLETE  Result Date: 06/16/2023    ECHOCARDIOGRAM REPORT   Patient Name:   Bailey Solomon Date of Exam: 06/16/2023 Medical Rec #:  [id number]        Height:       61.0 in Accession #:    [id number]       Weight:       235.3 lb Date of Birth:  [birth year]       BSA:          2.024 m Patient Age:    81 years         BP:           112/63 mmHg Patient Gender: F                HR:           100 bpm. Exam Location:  Inpatient Procedure: 2D Echo, Cardiac Doppler and Color Doppler Indications:    A-Fib I48.91  History:        Patient has no prior history of Echocardiogram examinations.                 Arrythmias:Atrial Fibrillation; Risk Factors:Former Smoker and                 Hypertension.  Sonographer:    Dondra Prader RVT RCS Referring Phys: [id number] Bailey Solomon  Sonographer Comments: Patient is obese. Image acquisition challenging due to patient body habitus. IMPRESSIONS  1. Left ventricular ejection fraction, by estimation, is 45 to 50%. The left ventricle has mildly decreased function. Left ventricular diastolic parameters are indeterminate.  2. Right ventricular systolic function is normal. The right ventricular size is normal.  3. Left atrial size was moderately dilated.  4. Trivial mitral valve regurgitation.  5. The aortic valve is tricuspid. Aortic valve regurgitation is not visualized. Aortic valve sclerosis/calcification is present, without any evidence of aortic stenosis.  6. The inferior vena cava is normal in size with greater than 50% respiratory variability,  suggesting right atrial pressure of 3 mmHg. FINDINGS  Left Ventricle: Left ventricular ejection fraction, by estimation, is 45 to 50%. The left ventricle has mildly decreased function. The left ventricular internal cavity size was normal in size. There is no left ventricular hypertrophy. Left ventricular diastolic parameters are indeterminate. Right Ventricle: The right ventricular size is normal. Right vetricular wall thickness was not assessed. Right ventricular systolic function is normal. Left Atrium: Left atrial size was moderately dilated. Right Atrium: Right atrial size was normal in size. Pericardium: There is no evidence of pericardial effusion.  Mitral Valve: There is mild thickening of the mitral valve leaflet(s). Mild mitral annular calcification. Trivial mitral valve regurgitation. Tricuspid Valve: The tricuspid valve is normal in structure. Tricuspid valve regurgitation is trivial. Aortic Valve: The aortic valve is tricuspid. Aortic valve regurgitation is not visualized. Aortic valve sclerosis/calcification is present, without any evidence of aortic stenosis. Aortic valve mean gradient measures 5.3 mmHg. Aortic valve peak gradient measures 9.2 mmHg. Aortic valve area, by VTI measures 2.22 cm. Pulmonic Valve: The pulmonic valve was not well visualized. Pulmonic valve regurgitation is not visualized. No evidence of pulmonic stenosis. Aorta: The aortic root and ascending aorta are structurally normal, with no evidence of dilitation. Venous: The inferior vena cava is normal in size with greater than 50% respiratory variability, suggesting right atrial pressure of 3 mmHg. IAS/Shunts: No atrial level shunt detected by color flow Doppler.  LEFT VENTRICLE PLAX 2D LVIDd:         3.95 cm LVIDs:         3.00 cm LV PW:         1.10 cm LV IVS:        1.10 cm LVOT diam:     1.90 cm LV SV:         48 LV SV Index:   24 LVOT Area:     2.84 cm  RIGHT VENTRICLE         IVC TAPSE (M-mode): 1.8 cm  IVC diam: 1.80 cm LEFT  ATRIUM              Index        RIGHT ATRIUM           Index LA diam:        3.90 cm  1.93 cm/m   RA Area:     14.30 cm LA Vol (A2C):   94.9 ml  46.89 ml/m  RA Volume:   34.90 ml  17.24 ml/m LA Vol (A4C):   96.0 ml  47.43 ml/m LA Biplane Vol: 102.0 ml 50.40 ml/m  AORTIC VALVE                     PULMONIC VALVE AV Area (Vmax):    1.90 cm      PV Vmax:       0.74 m/s AV Area (Vmean):   1.77 cm      PV Peak grad:  2.2 mmHg AV Area (VTI):     2.22 cm AV Vmax:           152.00 cm/s AV Vmean:          107.000 cm/s AV VTI:            0.217 m AV Peak Grad:      9.2 mmHg AV Mean Grad:      5.3 mmHg LVOT Vmax:         101.73 cm/s LVOT Vmean:        66.833 cm/s LVOT VTI:          0.170 m LVOT/AV VTI ratio: 0.78  AORTA Ao Root diam: 2.80 cm Ao Asc diam:  3.20 cm MITRAL VALVE MV Area (PHT): 4.17 cm     SHUNTS MV Decel Time: 182 msec     Systemic VTI:  0.17 m MV E velocity: 126.00 cm/s  Systemic Diam: 1.90 cm Dietrich Pates MD Electronically signed by Dietrich Pates MD Signature Date/Time: 06/16/2023/4:32:56 PM    Final    DG Chest [facility]  Result Date: 06/15/2023 CLINICAL DATA:  Shortness of breath EXAM: PORTABLE CHEST 1 VIEW COMPARISON:  Chest x-ray 01/17/2017 FINDINGS: Heart is enlarged. There central pulmonary vascular congestion. Right costophrenic angle has been excluded. There is no large pleural effusion, focal lung infiltrate or pneumothorax. No acute fractures are seen. IMPRESSION: Cardiomegaly with central pulmonary vascular congestion. Electronically Signed   By: Darliss Cheney M.D.   On: 06/15/2023 17:28     Labs:   Basic Metabolic Panel: Recent Labs  Lab 06/16/23 0557 06/17/23 0426 06/18/23 0033 06/19/23 0645 06/20/23 0016  NA 137 137 134* 137 136  K 3.2* 3.9 3.9 4.1 4.0  CL 102 104 102 104 103  CO2 26 24 24 25 26   GLUCOSE 118* 144* 135* 114* 114*  BUN 16 18 22 21  26*  CREATININE 0.76 0.78 0.70 0.64 0.87  CALCIUM 9.0 9.1 9.1 9.7 9.6  MG 2.0 2.0 2.0 2.2 2.3  PHOS  --  2.4* 4.0 4.2  --     GFR Estimated Creatinine Clearance: 57.7 mL/min (by C-G formula based on SCr of 0.87 mg/dL). Liver Function Tests: Recent Labs  Lab 06/17/23 0426 06/18/23 0033 06/19/23 0645  ALBUMIN 3.3* 3.0* 3.4*   No results for input(s): "LIPASE", "AMYLASE" in the last 168 hours. No results for input(s): "AMMONIA" in the last 168 hours. Coagulation profile No results for input(s): "INR", "PROTIME" in the last 168 hours.  CBC: Recent Labs  Lab 06/16/23 0557 06/17/23 0426 06/18/23 0033 06/19/23 0645 06/20/23 0016  WBC 10.0 10.6* 8.5 9.2 10.6*  HGB 12.3 12.7 11.7* 13.7 13.5  HCT 38.3 40.2 36.5 41.8 41.8  MCV 86.7 88.9 86.1 86.5 85.5  PLT 262 238 251 301 349   Cardiac Enzymes: No results for input(s): "CKTOTAL", "CKMB", "CKMBINDEX", "TROPONINI" in the last 168 hours. BNP: Invalid input(s): "POCBNP" CBG: No results for input(s): "GLUCAP" in the last 168 hours. D-Dimer No results for input(s): "DDIMER" in the last 72 hours. Hgb A1c No results for input(s): "HGBA1C" in the last 72 hours. Lipid Profile No results for input(s): "CHOL", "HDL", "LDLCALC", "TRIG", "CHOLHDL", "LDLDIRECT" in the last 72 hours. Thyroid function studies No results for input(s): "TSH", "T4TOTAL", "T3FREE", "THYROIDAB" in the last 72 hours.  Invalid input(s): "FREET3" Anemia work up No results for input(s): "VITAMINB12", "FOLATE", "FERRITIN", "TIBC", "IRON", "RETICCTPCT" in the last 72 hours. Microbiology Recent Results (from the past 240 hour(s))  SARS Coronavirus 2 by RT PCR (hospital order, performed in [facility] hospital lab) *cepheid single result test* Anterior Nasal Swab     Status: None   Collection Time: 06/15/23  4:47 PM   Specimen: Anterior Nasal Swab  Result Value Ref Range Status   SARS Coronavirus 2 by RT PCR NEGATIVE NEGATIVE Final    Comment: Performed at [facility], [address], [place], [place] [address]     Discharge Instructions:   Discharge Instructions     Amb  referral to AFIB Clinic   Complete by: As directed    Diet - low sodium heart healthy   Complete by: As directed    Discharge instructions   Complete by: As directed    Follow up with your primary care provider in one week. Check blood work at that time. Take medications as prescribed. Follow up with atrial fibrillation clinic as scheduled by the clinic on 8/6. Seek medical attention for worsening symptoms.   Increase activity slowly   Complete by: As directed       Allergies as of  06/20/2023       Reactions   Acetaminophen    Constipation, prefers not to take due to this   Codeine Nausea And Vomiting   Gelatin Other (See Comments)   Gel/capsules - unable to swallow them, "get stuck in throat"   Nsaids Other (See Comments)   Constipation        Medication List     STOP taking these medications    amLODipine 10 MG tablet Commonly known as: NORVASC   SUDAFED CONGESTION PO       TAKE these medications    ACIDOPHILUS PO Take 1 tablet by mouth daily.   ARNICA EX Apply 1 application topically as needed (Arthritis Pain in fingers).   diphenhydrAMINE 25 mg capsule Commonly known as: BENADRYL Take 25 mg by mouth at bedtime as needed for allergies.   Eliquis 5 MG Tabs tablet Generic drug: apixaban Take 1 tablet (5 mg total) by mouth 2 (two) times daily.   losartan 25 MG tablet Commonly known as: COZAAR Take 1 tablet (25 mg total) by mouth daily. Start taking on: June 21, 2023   metoprolol succinate 50 MG 24 hr tablet Commonly known as: TOPROL-XL Take 1 tablet (50 mg total) by mouth daily. Take with or immediately following a meal. Start taking on: June 21, 2023        Follow-up Information     [facility] Follow up.   Specialty: Home Health Services Why: Physical Therapy-office will call with visit times. Contact information: [address] [place] [address] [phone]         Eustace Pen, PA-C Follow up.    Specialty: Cardiology Why: Atrial Fibrillation Clinic on 6th floor of [facility] Follow-up appointment scheduled Tuesday Jul 02, 2023 at 2:00 PM Arrive 15 minutes prior to appointment time to check in Contact information: [address] [place] [address] [phone]                  Time coordinating discharge: 39 minutes  Signed:  Anouk Critzer  [facility] 06/20/2023, 9:12 PM

## 2023-06-20 NOTE — Progress Notes (Signed)
   Heart Failure Stewardship Pharmacist Progress Note   PCP: Kaleen Mask, MD PCP-Cardiologist: None    HPI:  82 yo F with PMH of HTN, HLD, prior smoking, and obesity.   Presented to the ED on 7/20 with shortness of breath and palpitations. CXR with pulmonary vascular congestion. BNP 207. Found to be in afib RVR. ECHO on 7/21 showed LVEF 45-50%, RV normal, trivial MR. Plan for TEE/DCCV on 7/25.   Current HF Medications: Beta Blocker: metoprolol XL 100 mg daily ACE/ARB/ARNI: losartan 25 mg daily  Prior to admission HF Medications: None  Pertinent Lab Values: Serum creatinine 0.87, BUN 26, Potassium 4.0, Sodium 136, BNP 207.3, Magnesium 2.3  Vital Signs: Weight: 232 lbs (admission weight: 235 lbs) Blood pressure: 90/70-130/90s  Heart rate: 80-100s  I/O: net -2.9L since admission  Medication Assistance / Insurance Benefits Check: Does the patient have prescription insurance?  Yes Type of insurance plan: [place] [employer]   Outpatient Pharmacy:  Prior to admission [facility] Is the patient willing to use [facility] TOC pharmacy at discharge? Yes Is the patient willing to transition their outpatient pharmacy to utilize a [facility] outpatient pharmacy?   No    Assessment: 1. 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 NYHA class III symptoms. - Off IV lasix. Strict I/Os and daily weights. Keep K>4 and Mg>2. - Continue metoprolol XL 100 mg daily - Transitioned from Entresto to losartan with some low BP. Consider restarting Entresto pending BP after cardioversion.  - Consider MRA prior to discharge if BP improves - Jardiance stopped by cardiology today - Stop pseudoephedrine at discharge   Plan: 1) Medication changes recommended at this time: - Stop pseudoephedrine at discharge  2) Patient assistance: - Farxiga/Jardiance copay $0 - Entresto copay $0  3)  Education  - Patient has been educated on current HF medications  and potential additions to HF medication regimen - Patient verbalizes understanding that over the next few months, these medication doses may change and more medications may be added to optimize HF regimen - Patient has been educated on basic disease state pathophysiology and goals of therapy   Sharen Hones, PharmD, BCPS Heart Failure Stewardship Pharmacist Phone [phone]

## 2023-06-20 NOTE — Progress Notes (Signed)
HR 57-65 post DCCV. Per d/w Dr. Mayford Knife, reduce Toprol to 50mg  daily starting tomorrow. OK to DC from our standpoint with f/u scheduled 8/6 with AF clinic.

## 2023-06-20 NOTE — CV Procedure (Signed)
Procedure: Electrical Cardioversion Indications:  Atrial Fibrillation  Procedure Details:  Consent: Risks of procedure as well as the alternatives and risks of each were explained to the (patient/caregiver).  Consent for procedure obtained.  Time Out: Verified patient identification, verified procedure, site/side was marked, verified correct patient position, special equipment/implants available, medications/allergies/relevent history reviewed, required imaging and test results available. PERFORMED.  Patient placed on cardiac monitor, pulse oximetry, supplemental oxygen as necessary.  Sedation given:  Propofol 178mg  Pacer pads placed anterior and posterior chest.  Cardioverted 1 time(s).  Cardioversion with synchronized biphasic 200J shock.  Evaluation: Findings: Post procedure EKG shows:  Sinus bradycardia Complications: None Patient did tolerate procedure well.  Time Spent Directly with the Patient:    Bailey Solomon 06/20/2023, 2:27 PM

## 2023-06-20 NOTE — Care Management Important Message (Signed)
Important Message  Patient Details  Name: Bailey Solomon MRN: [id number] Date of Birth: August 23, 1941   Medicare Important Message Given:  Yes     Renie Ora 06/20/2023, 8:41 AM

## 2023-06-20 NOTE — Interval H&P Note (Signed)
History and Physical Interval Note:  06/20/2023 12:32 PM  Bailey Solomon  has presented today for surgery, with the diagnosis of afib.  The various methods of treatment have been discussed with the patient and family. After consideration of risks, benefits and other options for treatment, the patient has consented to  Procedure(s): TRANSESOPHAGEAL ECHOCARDIOGRAM (N/A) CARDIOVERSION (N/A) as a surgical intervention.  The patient's history has been reviewed, patient examined, no change in status, stable for surgery.  I have reviewed the patient's chart and labs.  Questions were answered to the patient's satisfaction.     Meriam Sprague

## 2023-06-21 ENCOUNTER — Encounter (HOSPITAL_COMMUNITY): Payer: Self-pay | Admitting: Cardiology

## 2023-06-21 NOTE — Anesthesia Postprocedure Evaluation (Signed)
Anesthesia Post Note  Patient: Bailey Solomon  Procedure(s) Performed: TRANSESOPHAGEAL ECHOCARDIOGRAM CARDIOVERSION     Patient location during evaluation: Cath Lab Anesthesia Type: MAC Level of consciousness: awake and alert Pain management: pain level controlled Vital Signs Assessment: post-procedure vital signs reviewed and stable Respiratory status: spontaneous breathing, nonlabored ventilation, respiratory function stable and patient connected to nasal cannula oxygen Cardiovascular status: stable and blood pressure returned to baseline Postop Assessment: no apparent nausea or vomiting Anesthetic complications: no   No notable events documented.  Last Vitals:  Vitals:   06/20/23 1524 06/20/23 1628  BP:  111/74  Pulse:  66  Resp:  18  Temp: (!) 36.4 C 37.1 C  SpO2:      Last Pain:  Vitals:   06/20/23 1628  TempSrc: Oral  PainSc:                  Elizet Kaplan S

## 2023-07-02 ENCOUNTER — Encounter (HOSPITAL_COMMUNITY): Payer: Self-pay | Admitting: Internal Medicine

## 2023-07-02 ENCOUNTER — Ambulatory Visit (HOSPITAL_COMMUNITY)
Admit: 2023-07-02 | Discharge: 2023-07-02 | Disposition: A | Discharge status: Home / Self Care | Payer: 59 | Source: Ambulatory Visit | Attending: Internal Medicine | Admitting: Internal Medicine

## 2023-07-02 ENCOUNTER — Telehealth: Payer: Self-pay | Admitting: Pharmacist

## 2023-07-02 VITALS — BP 114/78 | HR 107 | Ht 61.0 in | Wt 231.2 lb

## 2023-07-02 DIAGNOSIS — Z7901 Long term (current) use of anticoagulants: Secondary | ICD-10-CM | POA: Insufficient documentation

## 2023-07-02 DIAGNOSIS — I110 Hypertensive heart disease with heart failure: Secondary | ICD-10-CM | POA: Insufficient documentation

## 2023-07-02 DIAGNOSIS — I509 Heart failure, unspecified: Secondary | ICD-10-CM | POA: Insufficient documentation

## 2023-07-02 DIAGNOSIS — D6869 Other thrombophilia: Secondary | ICD-10-CM | POA: Insufficient documentation

## 2023-07-02 DIAGNOSIS — Z6841 Body Mass Index (BMI) 40.0 and over, adult: Secondary | ICD-10-CM | POA: Diagnosis not present

## 2023-07-02 DIAGNOSIS — I4891 Unspecified atrial fibrillation: Secondary | ICD-10-CM | POA: Diagnosis not present

## 2023-07-02 DIAGNOSIS — I480 Paroxysmal atrial fibrillation: Secondary | ICD-10-CM | POA: Diagnosis present

## 2023-07-02 DIAGNOSIS — Z79899 Other long term (current) drug therapy: Secondary | ICD-10-CM | POA: Insufficient documentation

## 2023-07-02 MED ORDER — APIXABAN 5 MG PO TABS: 5.0000 mg | ORAL_TABLET | Freq: Two times a day (BID) | ORAL | 4 refills | Status: AC

## 2023-07-02 MED ORDER — METOPROLOL SUCCINATE ER 50 MG PO TB24: 50.0000 mg | ORAL_TABLET | Freq: Every day | ORAL | 4 refills | Status: AC

## 2023-07-02 NOTE — Telephone Encounter (Signed)
Medication list reviewed in anticipation of upcoming Tikosyn initiation. Patient has prn Benadryl on her med list which she should stop taking (afib note today mentions she isn't taking - would remove from med list if confirmed). She does not take any contraindicated medications.   Patient is anticoagulated on Eliquis 5mg  BID on the appropriate dose. Please ensure that patient has not missed any anticoagulation doses in the 3 weeks prior to Tikosyn initiation.

## 2023-07-02 NOTE — Progress Notes (Addendum)
Primary Care Physician: Kaleen Mask, MD Primary Cardiologist: None Electrophysiologist: None     Referring Physician: Dr. Huel Cote CHIQUITA HAKIM is a 82 y.o. female with a history of new onset of CHF, smoking, morbid obesity, HTN, DCM, and paroxysmal atrial fibrillation who presents for consultation in the [facility]. She presented to hospital with palpitations and SOB found to be in Afib with RVR; admitted 7/20-25. She underwent successful TEE/DCCV on 7/25. Discharged on Toprol 50 mg daily. Patient is on Eliquis for a CHADS2VASC score of 5.  On evaluation today, she is currently in Afib with RVR. She has ERAF. She notes that overall she has lost 1 lb since hospital visit. She has not missed any doses of Eliquis. She does not take benadryl.   Today, she denies symptoms of palpitations, chest pain, shortness of breath, orthopnea, PND, lower extremity edema, dizziness, presyncope, syncope, snoring, daytime somnolence, bleeding, or neurologic sequela. The patient is tolerating medications without difficulties and is otherwise without complaint today.    she has a BMI of Body mass index is 43.68 kg/m.Marland Kitchen Filed Weights   07/02/23 1339  Weight: 104.9 kg    Current Outpatient Medications  Medication Sig Dispense Refill   apixaban (ELIQUIS) 5 MG TABS tablet Take 1 tablet (5 mg total) by mouth 2 (two) times daily. 60 tablet 2   ARNICA EX Apply 1 application topically as needed (Arthritis Pain in fingers).     Lactobacillus (ACIDOPHILUS PO) Take 1 tablet by mouth daily.     levocetirizine (XYZAL) 5 MG tablet Take 5 mg by mouth as needed for allergies.     losartan (COZAAR) 25 MG tablet Take 1 tablet (25 mg total) by mouth daily. 30 tablet 2   metoprolol succinate (TOPROL-XL) 50 MG 24 hr tablet Take 1 tablet (50 mg total) by mouth daily. Take with or immediately following a meal. 30 tablet 2   diphenhydrAMINE (BENADRYL) 25 mg capsule Take 25 mg by  mouth at bedtime as needed for allergies. (Patient not taking: Reported on 07/02/2023)     No current facility-administered medications for this encounter.    Atrial Fibrillation Management history:  Previous antiarrhythmic drugs: None Previous cardioversions: 06/20/23 Previous ablations: None Anticoagulation history: Eliquis   ROS- All systems are reviewed and negative except as per the HPI above.  Physical Exam: BP 114/78   Pulse (!) 107   Ht 5\' 1"  (1.549 m)   Wt 104.9 kg   BMI 43.68 kg/m   GEN: Well nourished, well developed in no acute distress NECK: No JVD; No carotid bruits CARDIAC: Irregularly irregular rate and rhythm, no murmurs, rubs, gallops RESPIRATORY:  Clear to auscultation without rales, wheezing or rhonchi  ABDOMEN: Soft, non-tender, non-distended EXTREMITIES:  No edema; No deformity   EKG today demonstrates  Vent. rate 107 BPM PR interval * ms QRS duration 70 ms QT/QTcB 302/403 ms P-R-T axes * -43 40 Atrial fibrillation with rapid ventricular response with premature ventricular or aberrantly conducted complexes Left axis deviation Septal infarct , age undetermined Abnormal ECG When compared with ECG of 20-Jun-2023 14:44, PREVIOUS ECG IS PRESENT  Echo 06/16/23 demonstrated   1. Left ventricular ejection fraction, by estimation, is 45 to 50%. The  left ventricle has mildly decreased function. Left ventricular diastolic  parameters are indeterminate.   2. Right ventricular systolic function is normal. The right ventricular  size is normal.   3. Left atrial size was moderately dilated.  4. Trivial mitral valve regurgitation.   5. The aortic valve is tricuspid. Aortic valve regurgitation is not  visualized. Aortic valve sclerosis/calcification is present, without any  evidence of aortic stenosis.   6. The inferior vena cava is normal in size with greater than 50%  respiratory variability, suggesting right atrial pressure of 3 mmHg.   ASSESSMENT &  PLAN CHA2DS2-VASc Score = 5  The patient's score is based upon: CHF History: 1 HTN History: 1 Diabetes History: 0 Stroke History: 0 Vascular Disease History: 0 Age Score: 2 Gender Score: 1       ASSESSMENT AND PLAN: Paroxysmal Atrial Fibrillation (ICD10:  I48.0) The patient's CHA2DS2-VASc score is 5, indicating a 7.2% annual risk of stroke.    She is currently in Afib. She has ERAF since successful TEE/DCCV on 7/25. We discussed options going forward for treatment which include currently Tikosyn or amiodarone. Her BMI makes her currently an unfavorable candidate for ablation. We discussed the two medications and after she has decided to proceed with Tikosyn admission.   She will be scheduled for Tikosyn admission.  CrCl estimate 85 mL/min would qualify for 500 mcg BID dosage. Qtc in SR 397 ms. Current medication list today shows benadryl but she does not take this. She is not on any other medications that appear to be contraindicated with Tikosyn.   Secondary Hypercoagulable State (ICD10:  D68.69) The patient is at significant risk for stroke/thromboembolism based upon her CHA2DS2-VASc Score of 5.  Continue Apixaban (Eliquis).  No missed doses.     Follow up for Tikosyn admission once scheduled.    Lake Bells, PA-C  Afib Clinic [facility] [address] [place], [place] [address] [phone]

## 2023-07-05 ENCOUNTER — Other Ambulatory Visit: Payer: Self-pay | Admitting: Cardiology

## 2023-07-05 ENCOUNTER — Telehealth: Payer: Self-pay | Admitting: Cardiology

## 2023-07-05 DIAGNOSIS — I5020 Unspecified systolic (congestive) heart failure: Secondary | ICD-10-CM

## 2023-07-05 MED ORDER — POTASSIUM CHLORIDE CRYS ER 20 MEQ PO TBCR: 20.0000 meq | EXTENDED_RELEASE_TABLET | Freq: Every day | ORAL | 0 refills | Status: DC

## 2023-07-05 MED ORDER — FUROSEMIDE 40 MG PO TABS: 40.0000 mg | ORAL_TABLET | Freq: Every day | ORAL | 0 refills | Status: DC

## 2023-07-05 NOTE — Telephone Encounter (Signed)
Pt and her daughter called because of increasing edema.  They were told by PA Nelva Bush to call for fluid medication if she began swelling.    She was not discharge with diuretic.  She is back in atrial fib on 07/02/23 OV.  Rate 104  Plan for tikosyn load in future.    Today she has increasing dyspnea and is dizzy more so when she lies down not with standing.  Her BP is elevated on home cuff 157/97 and P 100.    Her BP is not usually that high. We discussed coming to ER to be seen and pt and daughter would prefer home dose of diuretics for now.  I discussed with DR Tereso Newcomer and we will add lasix 40 mg daily along with Kdur 20 meq for 5 days but will have her check labs on Monday.   Both pt and daughter are aware if no improvement or if increased issues to go to ER.  They are in agreement.    They will need a follow up appt for CHF next week with APP -- Dr. Mayford Knife saw her in the hospital but will ask atrial fib clinic to see.

## 2023-07-10 ENCOUNTER — Telehealth (HOSPITAL_COMMUNITY): Payer: Self-pay | Admitting: Vascular Surgery

## 2023-07-10 NOTE — Telephone Encounter (Signed)
Lvm to make new TOC APPT 8/20

## 2023-07-12 ENCOUNTER — Other Ambulatory Visit (HOSPITAL_COMMUNITY): Payer: Self-pay

## 2023-07-16 ENCOUNTER — Encounter (HOSPITAL_COMMUNITY): Payer: Self-pay

## 2023-07-16 ENCOUNTER — Ambulatory Visit (HOSPITAL_COMMUNITY)
Admission: RE | Admit: 2023-07-16 | Discharge: 2023-07-16 | Disposition: A | Discharge status: Home / Self Care | Payer: 59 | Source: Ambulatory Visit | Attending: Adult Health | Admitting: Adult Health

## 2023-07-16 ENCOUNTER — Other Ambulatory Visit (HOSPITAL_COMMUNITY): Payer: Self-pay

## 2023-07-16 VITALS — BP 142/88 | HR 86 | Wt 227.6 lb

## 2023-07-16 DIAGNOSIS — Z79899 Other long term (current) drug therapy: Secondary | ICD-10-CM | POA: Diagnosis not present

## 2023-07-16 DIAGNOSIS — I480 Paroxysmal atrial fibrillation: Secondary | ICD-10-CM

## 2023-07-16 DIAGNOSIS — E785 Hyperlipidemia, unspecified: Secondary | ICD-10-CM | POA: Insufficient documentation

## 2023-07-16 DIAGNOSIS — I5020 Unspecified systolic (congestive) heart failure: Secondary | ICD-10-CM | POA: Diagnosis present

## 2023-07-16 DIAGNOSIS — I110 Hypertensive heart disease with heart failure: Secondary | ICD-10-CM | POA: Diagnosis not present

## 2023-07-16 DIAGNOSIS — Z7901 Long term (current) use of anticoagulants: Secondary | ICD-10-CM | POA: Insufficient documentation

## 2023-07-16 DIAGNOSIS — R0683 Snoring: Secondary | ICD-10-CM

## 2023-07-16 DIAGNOSIS — M25569 Pain in unspecified knee: Secondary | ICD-10-CM | POA: Insufficient documentation

## 2023-07-16 DIAGNOSIS — E669 Obesity, unspecified: Secondary | ICD-10-CM | POA: Insufficient documentation

## 2023-07-16 DIAGNOSIS — Z6841 Body Mass Index (BMI) 40.0 and over, adult: Secondary | ICD-10-CM | POA: Insufficient documentation

## 2023-07-16 MED ORDER — FUROSEMIDE 40 MG PO TABS: 40.0000 mg | ORAL_TABLET | Freq: Every day | ORAL | 0 refills | Status: AC | PRN

## 2023-07-16 MED ORDER — POTASSIUM CHLORIDE CRYS ER 20 MEQ PO TBCR: 20.0000 meq | EXTENDED_RELEASE_TABLET | Freq: Every day | ORAL | 0 refills | Status: AC | PRN

## 2023-07-16 MED ORDER — EMPAGLIFLOZIN 10 MG PO TABS: 10.0000 mg | ORAL_TABLET | Freq: Every day | ORAL | 0 refills | Status: AC

## 2023-07-16 NOTE — Patient Instructions (Addendum)
EKG done today.  No Labs done today.  START Jardiance 10mg  (1tablet) by mouth daily.  DECREASE Lasix to 20mg  (1 tablet) by mouth daily as needed.  DECREASE Potassium to (1 tablet) by mouth daily as needed (only when you take Lasix)   No other medication changes were made. Please continue all current medications as prescribed.  Thank you for allowing [place] to provide your heart failure care after your recent hospitalization. Please follow-up with Dr. Mayford Knife

## 2023-07-16 NOTE — Progress Notes (Signed)
HEART & VASCULAR TRANSITION OF CARE CONSULT NOTE     Referring Physician:Dr Pokhrel Primary Care: Dr Jeannetta Nap  Primary Cardiologist: Dr Mayford Knife   HPI: Referred to clinic by Dr Tyson Babinski for heart failure consultation.   Ms Bailey Solomon is a 82 year old with a history of PAF, HTN, HLD, obesity, and HFmEF.   Admitted 06/15/23 with A fib RVR and new reduced EF 45-50%.  Diuresed with IV lasix. Underwent TEE/Cardioversion with restoration of SR. Dischcarged on bb and arb.   She was seen in the A fib clinic 07/02/23 and was back in A fib. Planning for Tikosyn admit soon.   Overall feeling fine. Denies SOB/PND/Orthopnea. Limited by knee pain. Appetite ok. No fever or chills. Unable to weigh at home. Followed by HHPT Taking all medications. Lives alone. Her son helps with cooking. She is able to go to the grocery store.  She tells me she snores but is positive she doesn't have sleep apnea.     Cardiac Testing  Echo   1. Left ventricular ejection fraction, by estimation, is 45 to 50%. The  left ventricle has mildly decreased function. Left ventricular diastolic  parameters are indeterminate.   2. Right ventricular systolic function is normal. The right ventricular  size is normal.   3. Left atrial size was moderately dilated.   4. Trivial mitral valve regurgitation.   5. The aortic valve is tricuspid. Aortic valve regurgitation is not  visualized. Aortic valve sclerosis/calcification is present, without any  evidence of aortic stenosis.   Review of Systems: [y] = yes, [ ]  = no   General: Weight gain [ ] ; Weight loss [ ] ; Anorexia [ ] ; Fatigue [ Y]; Fever [ ] ; Chills [ ] ; Weakness [ ]   Cardiac: Chest pain/pressure [ ] ; Resting SOB [ ] ; Exertional SOB [ ] ; Orthopnea [ ] ; Pedal Edema [ ] ; Palpitations [ ] ; Syncope [ ] ; Presyncope [ ] ; Paroxysmal nocturnal dyspnea[ ]   Pulmonary: Cough [ ] ; Wheezing[ ] ; Hemoptysis[ ] ; Sputum [ ] ; Snoring [ ]   GI: Vomiting[ ] ; Dysphagia[ ] ; Melena[ ] ; Hematochezia [  ]; Heartburn[ ] ; Abdominal pain [ ] ; Constipation [ ] ; Diarrhea [ ] ; BRBPR [ ]   GU: Hematuria[ ] ; Dysuria [ ] ; Nocturia[ ]   Vascular: Pain in legs with walking [ ] ; Pain in feet with lying flat [ ] ; Non-healing sores [ ] ; Stroke [ ] ; TIA [ ] ; Slurred speech [ ] ;  Neuro: Headaches[ ] ; Vertigo[ ] ; Seizures[ ] ; Paresthesias[ ] ;Blurred vision [ ] ; Diplopia [ ] ; Vision changes [ ]   Ortho/Skin: Arthritis [ Y]; Joint pain [ Y]; Muscle pain [ ] ; Joint swelling [ ] ; Back Pain [ Y]; Rash [ ]   Psych: Depression[ ] ; Anxiety[ ]   Heme: Bleeding problems [ ] ; Clotting disorders [ ] ; Anemia [ ]   Endocrine: Diabetes [ ] ; Thyroid dysfunction[ ]    Past Medical History:  Diagnosis Date   Anxiety    Arthritis    "qwhere"   Chronic bronchitis (HCC)    "quit after I stopped smoking"   Depression    Exertional shortness of breath    Hyperlipidemia    Hypertension    Obesity    Prediabetes    Shingles     Current Outpatient Medications  Medication Sig Dispense Refill   apixaban (ELIQUIS) 5 MG TABS tablet Take 1 tablet (5 mg total) by mouth 2 (two) times daily. 60 tablet 4   ARNICA EX Apply 1 application topically as needed (Arthritis Pain in fingers).  furosemide (LASIX) 40 MG tablet Take 1 tablet (40 mg total) by mouth daily. 5 tablet 0   Lactobacillus (ACIDOPHILUS PO) Take 1 tablet by mouth daily.     levocetirizine (XYZAL) 5 MG tablet Take 5 mg by mouth as needed for allergies.     losartan (COZAAR) 25 MG tablet Take 1 tablet (25 mg total) by mouth daily. 30 tablet 2   metoprolol succinate (TOPROL-XL) 50 MG 24 hr tablet Take 1 tablet (50 mg total) by mouth daily. Take with or immediately following a meal. 30 tablet 4   potassium chloride SA (KLOR-CON M) 20 MEQ tablet Take 1 tablet (20 mEq total) by mouth daily. (Patient taking differently: Take 10 mEq by mouth daily. Take 1/2 tablet daily) 5 tablet 0   No current facility-administered medications for this encounter.    Allergies  Allergen  Reactions   Acetaminophen     Constipation, prefers not to take due to this   Codeine Nausea And Vomiting   Gelatin Other (See Comments)    Gel/capsules - unable to swallow them, "get stuck in throat"   Nsaids Other (See Comments)    Constipation      Social History   Socioeconomic History   Marital status: Divorced    Spouse name: Not on file   Number of children: 4   Years of education: Not on file   Highest education level: Not on file  Occupational History   Not on file  Tobacco Use   Smoking status: Former    Current packs/day: 3.00    Average packs/day: 3.0 packs/day for 19.0 years (57.0 ttl pk-yrs)    Types: Cigarettes   Smokeless tobacco: Never   Tobacco comments:    "quit smoking in fall 1975"  Substance and Sexual Activity   Alcohol use: Yes    Comment: 08/03/2014 "glass of wine a couple times/month"   Drug use: No   Sexual activity: Never  Other Topics Concern   Not on file  Social History Narrative   Not on file   Social Determinants of Health   Financial Resource Strain: Not on file  Food Insecurity: No Food Insecurity (06/16/2023)   Hunger Vital Sign    Worried About Running Out of Food in the Last Year: Never true    Ran Out of Food in the Last Year: Never true  Transportation Needs: No Transportation Needs (06/16/2023)   PRAPARE - Administrator, Civil Service (Medical): No    Lack of Transportation (Non-Medical): No  Physical Activity: Not on file  Stress: Not on file  Social Connections: Unknown (04/09/2022)   Received from [facility]   Social Network    Social Network: Not on file  Intimate Partner Violence: Not At Risk (06/16/2023)   Humiliation, Afraid, Rape, and Kick questionnaire    Fear of Current or Ex-Partner: No    Emotionally Abused: No    Physically Abused: No    Sexually Abused: No      Family History  Problem Relation Age of Onset   Asthma Other    Cancer Other    Hypertension Other    Heart attack Other      Vitals:   07/16/23 0916  BP: (!) 142/88  Pulse: 86  SpO2: 96%  Weight: 103.2 kg (227 lb 9.6 oz)    PHYSICAL EXAM: General:  Arrived in a wheel chair.  No respiratory difficulty HEENT: normal Neck: supple. no JVD. Carotids 2+ bilat; no bruits. No lymphadenopathy or  thryomegaly appreciated. Cor: PMI nondisplaced. Regular rate & rhythm. No rubs, gallops or murmurs. Lungs: clear Abdomen: soft, nontender, nondistended. No hepatosplenomegaly. No bruits or masses. Good bowel sounds. Extremities: no cyanosis, clubbing, rash, edema Neuro: alert & oriented x 3, cranial nerves grossly intact. moves all 4 extremities w/o difficulty. Affect pleasant.  ECG: A fib 82    ASSESSMENT & PLAN: 1. Chronic HFmEF Echo EF 45-50% suspected tachy-mediated with new A Fib RVR. Underwent successful  Cardioversion but now back in Afib.  NYHA II- Appears euvolemic.  GDMT  Diuretic- Change lasix and potassium to as needed.  BB-Continue current dose of Toprol XL 50 mg daily  Ace/ARB/ARNI- Continue losartan  MRA- Consider soon.  SGLT2i- Start jardiance 10 mg daily . Discussed purpose and side effects.   2.PAF  S/P DC_CV with restoration of SR. Went back in A fib after discharge.  EKG today - in A fib . She was seen in the A fib Clinic with plan to load on Tikosyn in a few weeks.   On eliquis 5 mg twice a day   3. Obesity  Body mass index is 43 kg/m.  4. Snores Discussed sleep study but she is adamant she does not want to pursue.     Referred to HFSW (PCP, Medications, Transportation, ETOH Abuse, Drug Abuse, Insurance, Financial ):  No Refer to Pharmacy: Yes for [employer].  Refer to Home Health:NO Refer to Advanced Heart Failure Clinic: No  Refer to General Cardiology: Dr Malachy Mood patient  Follow up as needed. She had labs at Dr Jeannetta Nap office. Will ask for copy.   Bailey Breault NP-C  9:28 AM

## 2023-07-23 ENCOUNTER — Telehealth (HOSPITAL_COMMUNITY): Payer: Self-pay | Admitting: Cardiology

## 2023-07-23 NOTE — Telephone Encounter (Signed)
Returned call to patient and made her aware of the below. Patient verbalized understanding.   Advised patient to call back to office with any issues, questions, or concerns. Patient verbalized understanding.

## 2023-07-23 NOTE — Telephone Encounter (Signed)
Can trial off it. She doesn't have known DM, I'd by hypoglycemia. Dizziness and elevated HR also not common side effects unless dehydrated. If feels the same off the med after a few days would restart it.   Follow-up with Dr. Mayford Knife as scheduled.

## 2023-07-23 NOTE — Telephone Encounter (Signed)
Since starting jardiance reports the following side effects- Low blood sugar Dizziness Elevated HR    Cost $500  Pt will stop-message to provider as Bailey Solomon

## 2023-08-01 ENCOUNTER — Telehealth (HOSPITAL_COMMUNITY): Payer: Self-pay | Admitting: *Deleted

## 2023-08-01 MED ORDER — AMIODARONE HCL 200 MG PO TABS: ORAL_TABLET | ORAL | 3 refills | Status: DC

## 2023-08-01 NOTE — Telephone Encounter (Signed)
Patient called in stating she has decided she would prefer to proceed with Amiodarone instead of Tikosyn to treat afib at this time. Discussed with Landry Mellow PA will start Amiodarone 200mg  BID for 14 days then reduce to 200mg  every day. Follow up 2 weeks after start - dccv to be setup after loading.

## 2023-08-02 NOTE — Telephone Encounter (Addendum)
Upon speaking to patient to go over instructions of amiodarone start/dccv plan. Pt states she will NOT be having another cardioversion the first one did not work and she is not doing that again. Attempted to explain to patient rationale behind why another attempt with cardioversion is performed once AAD has been added. Pt became agitated on the call stating "well I guess I won't be starting anything because I'm not doing another cardioversion."  Again attempted to educate patient but patient stated she didn't think she would keep follow up with Dr Mayford Knife either. Advised patient she should keep scheduled follow up next week with Dr Mayford Knife patient and encouraged follow up visit here to further discuss rhythm control but patient abruptly hung up the phone.    Amiodarone order discontinued at pharmacy.

## 2023-08-06 ENCOUNTER — Ambulatory Visit: Payer: 59 | Admitting: Cardiology

## 2023-08-08 ENCOUNTER — Telehealth: Payer: Self-pay

## 2023-08-08 NOTE — Telephone Encounter (Signed)
-----   Message from Nurse Deon Pilling sent at 08/06/2023  9:01 AM EDT ----- Regarding: reschedule This is the patient I just put on schedule in November, call her and make sure that is okay. She said she was fine and not in a rush.

## 2023-08-08 NOTE — Telephone Encounter (Signed)
Called to schedule follow up, no answer. No DPR on file, left message with no identifiers asking recipient to call our office.

## 2023-08-09 ENCOUNTER — Telehealth: Payer: Self-pay | Admitting: Cardiology

## 2023-08-09 NOTE — Telephone Encounter (Signed)
Left a message to call back.

## 2023-08-09 NOTE — Telephone Encounter (Signed)
Follow Up:     Patient is returning Erica's call from yesterday.

## 2023-08-12 NOTE — Telephone Encounter (Signed)
Called to confirm patient's appt 10/02/23. Patient states this appointment will work for her.

## 2023-08-19 ENCOUNTER — Ambulatory Visit (HOSPITAL_COMMUNITY): Payer: 59 | Admitting: Internal Medicine

## 2023-10-02 ENCOUNTER — Ambulatory Visit: Payer: 59 | Admitting: Cardiology
